# Patient Record
Sex: Female | Born: 1975 | Race: Black or African American | Hispanic: No | Marital: Married | Smoking: Former smoker
Health system: Southern US, Community
[De-identification: ages and names within clinical notes are randomized; demographics above are authoritative.]

## PROBLEM LIST (undated history)

## (undated) DIAGNOSIS — D649 Anemia, unspecified: Secondary | ICD-10-CM

## (undated) DIAGNOSIS — K219 Gastro-esophageal reflux disease without esophagitis: Secondary | ICD-10-CM

## (undated) DIAGNOSIS — D259 Leiomyoma of uterus, unspecified: Secondary | ICD-10-CM

## (undated) DIAGNOSIS — G500 Trigeminal neuralgia: Secondary | ICD-10-CM

## (undated) DIAGNOSIS — I10 Essential (primary) hypertension: Secondary | ICD-10-CM

---

## 1997-08-23 ENCOUNTER — Inpatient Hospital Stay (HOSPITAL_COMMUNITY): Admission: AD | Admit: 1997-08-23 | Discharge: 1997-08-26 | Payer: Self-pay | Admitting: Obstetrics & Gynecology

## 1997-08-23 ENCOUNTER — Emergency Department (HOSPITAL_COMMUNITY): Admission: EM | Admit: 1997-08-23 | Discharge: 1997-08-23 | Payer: Self-pay | Admitting: Emergency Medicine

## 1997-10-24 ENCOUNTER — Other Ambulatory Visit: Admission: RE | Admit: 1997-10-24 | Discharge: 1997-10-24 | Payer: Self-pay | Admitting: *Deleted

## 1998-03-20 ENCOUNTER — Inpatient Hospital Stay (HOSPITAL_COMMUNITY): Admission: AD | Admit: 1998-03-20 | Discharge: 1998-03-22 | Payer: Self-pay | Admitting: *Deleted

## 1998-04-22 ENCOUNTER — Other Ambulatory Visit: Admission: RE | Admit: 1998-04-22 | Discharge: 1998-04-22 | Payer: Self-pay | Admitting: *Deleted

## 1998-05-07 ENCOUNTER — Emergency Department (HOSPITAL_COMMUNITY): Admission: EM | Admit: 1998-05-07 | Discharge: 1998-05-07 | Payer: Self-pay | Admitting: Emergency Medicine

## 1999-03-31 ENCOUNTER — Emergency Department (HOSPITAL_COMMUNITY): Admission: EM | Admit: 1999-03-31 | Discharge: 1999-03-31 | Payer: Self-pay | Admitting: Emergency Medicine

## 1999-05-09 ENCOUNTER — Emergency Department (HOSPITAL_COMMUNITY): Admission: EM | Admit: 1999-05-09 | Discharge: 1999-05-09 | Payer: Self-pay | Admitting: Emergency Medicine

## 2002-10-30 ENCOUNTER — Emergency Department (HOSPITAL_COMMUNITY): Admission: EM | Admit: 2002-10-30 | Discharge: 2002-10-31 | Payer: Self-pay | Admitting: Emergency Medicine

## 2004-07-03 ENCOUNTER — Other Ambulatory Visit: Admission: RE | Admit: 2004-07-03 | Discharge: 2004-07-03 | Payer: Self-pay | Admitting: Obstetrics and Gynecology

## 2005-01-20 ENCOUNTER — Inpatient Hospital Stay (HOSPITAL_COMMUNITY): Admission: AD | Admit: 2005-01-20 | Discharge: 2005-01-22 | Payer: Self-pay | Admitting: Obstetrics and Gynecology

## 2005-10-21 ENCOUNTER — Emergency Department (HOSPITAL_COMMUNITY): Admission: EM | Admit: 2005-10-21 | Discharge: 2005-10-22 | Payer: Self-pay | Admitting: Emergency Medicine

## 2006-11-16 ENCOUNTER — Emergency Department (HOSPITAL_COMMUNITY): Admission: EM | Admit: 2006-11-16 | Discharge: 2006-11-16 | Payer: Self-pay | Admitting: Emergency Medicine

## 2007-07-03 ENCOUNTER — Emergency Department (HOSPITAL_COMMUNITY): Admission: EM | Admit: 2007-07-03 | Discharge: 2007-07-03 | Payer: Self-pay | Admitting: Emergency Medicine

## 2007-07-04 ENCOUNTER — Emergency Department (HOSPITAL_COMMUNITY): Admission: EM | Admit: 2007-07-04 | Discharge: 2007-07-04 | Payer: Self-pay | Admitting: Emergency Medicine

## 2007-07-05 ENCOUNTER — Emergency Department (HOSPITAL_COMMUNITY): Admission: EM | Admit: 2007-07-05 | Discharge: 2007-07-05 | Payer: Self-pay | Admitting: Emergency Medicine

## 2010-09-25 NOTE — Discharge Summary (Signed)
NAMEEMONNIE, CANNADY             ACCOUNT NO.:  [id number]   MEDICAL RECORD NO.:  [id number]          PATIENT TYPE:  INP   LOCATION:  9312                          FACILITY:  [facility]   PHYSICIAN:  Naima A. Dillard, M.D. DATE OF BIRTH:  23-May-1975   DATE OF ADMISSION:  01/20/2005  DATE OF DISCHARGE:  01/22/2005                                 DISCHARGE SUMMARY   DISCHARGE DIAGNOSIS:  Pyelonephritis.   HISTORY OF PRESENT ILLNESS:  Ms. Kuipers is a 35 year old female, para 3-0-0-  3, who has a history of recurrent pyelonephritis, who presents with symptoms  of the same.  Please see the patient's dictated history and physical  examination for details.   PREOPERATIVE/ADMISSION PHYSICAL EXAMINATION:  VITAL SIGNS:  Temperature  100.1 which decreased to 98.4 degrees Fahrenheit orally, pulse 82,  respirations 20, blood pressure 143/81 with a followup pressure of 102/53.  GENERAL:  Within normal limits.  MUSCULOSKELETAL:  Do note that the patient did have bilateral CVA  tenderness.  PELVIC:  Deferred at the patient's request.   HOSPITAL COURSE:  On the day of admission, the patient's urinalysis revealed  a large amount of leukocytes with a positive nitrite, followed by a  microscopic revealing 21-50 white blood cells.  The patient was placed on IV  antibiotics as her temperature reached a maximum of 101.9 degrees Fahrenheit  orally.  She responded appropriately to this therapy and by hospital day #3  had remained afebrile for 24 hours.  Since this is the patient's third  episode of pyelonephritis, she will be referred to a urologist for  evaluation as an outpatient.   DISCHARGE MEDICATIONS:  Cipro 500 mg, one tablet twice daily at least eight  hours apart for 11 additional days.   FOLLOWUP:  1.  The patient is scheduled for a follow up with Dr. Normand Sloop on February 08, 2005 at 9:30 a.m.  2.  The patient will also be notified by Dr. Redmond Baseman office of a scheduled      appointment with a  urologist.   DISCHARGE INSTRUCTIONS:  1.  The patient's activity is without restriction.  2.  Diet is without restriction.      [place] J. Adline Peals.      Naima A. Normand Sloop, M.D.  Electronically Signed    EJP/MEDQ  D:  02/24/2005  T:  02/24/2005  Job:  [id number]

## 2010-09-25 NOTE — H&P (Signed)
Jamie James, Jamie James             ACCOUNT NO.:  [id number]   MEDICAL RECORD NO.:  [id number]          PATIENT TYPE:  INP   LOCATION:  9312                          FACILITY:  [facility]   PHYSICIAN:  Naima A. Dillard, M.D. DATE OF BIRTH:  1976/05/08   DATE OF ADMISSION:  01/20/2005  DATE OF DISCHARGE:                                HISTORY & PHYSICAL   ADMISSION DIAGNOSIS:  1.  Pyelonephritis.  2.  Abnormal menstrual cycle.   HISTORY OF PRESENT ILLNESS:  The patient is a 35 year old gravida 3, para 3,  LMP first week of August. She does report that on Sunday she had cramping  that was  suprapubic with spotting, and then developed back pain with  dysuria, urgency, and frequency. She does have a history of pyelonephritis  x3 in 1999, January 2006, and at present.  She denies any history of kidney  disease or kidney stones. She is not having any abdominal pain. No nausea or  vomiting.  She is admitted to [facility] for IV fluids  and IV antibiotics.  At home, her temperature was 103, and on admission to  MAU it was 101.   MEDICAL HISTORY:  1.  Unremarkable, except the patient's history of multiple episodes of      pyelonephritis.  2.  The patient had an abnormal Pap with gonorrhea diagnosed in March 2006.      She was treated for gonorrhea and her follow-up Pap smear was normal.   ALLERGIES:  She has no known drug allergies.   MEDICATIONS:  She takes no current medications.   SOCIAL HISTORY:  She denies the use of tobacco, alcohol or illicit drugs.   LABORATORY DATA:  Her lab work today - CBC shows WBC 11.6, hemoglobin 12.1,  hematocrit 36.0 and platelets 216,000. Her differential shows neutrophils  87, absolute granulocytes 10.1, with the rest being within normal limits.  Her metabolic panel was within normal limits except potassium 3.4 and  glucose 101.  Her urine specimen showed positive nitrites, 21-50 WBCs, and  cloudy.  She had blood cultures done x2.  Urine  pregnancy test is negative.   REVIEW OF SYSTEMS:  The patient is typical of one with pyelonephritis.  Admitted for IV hydration and IV antibiotics.   PHYSICAL EXAMINATION:  VITAL SIGNS:  Temperature on admission was 101 and  came down to 98.4,  pulse 82, respirations 20. Initial blood pressure 143/81  and follow-up 102/53.  HEENT:  Unremarkable.  HEART:  Regular rate and rhythm.  LUNGS:  Clear.  ABDOMEN:  Soft and nontender.  BACK:  Shows bilateral CVA tenderness.  GU EXAM:  Deferred by the patient.  EXTREMITIES:  Show no edema, no calf tenderness.   ASSESSMENT:  1.  Pyelonephritis.  2.  Abnormal menstrual cycle.   PLAN:  Admit per Dr. Jaymes Graff.  IV fluid hydration, IV antibiotics.  Check urine culture and blood cultures.  CBC with differential in the a.m.  Refer the patient to urology once the patient leaves the hospital.      Rica Koyanagi, C.N.M.  Naima A. Normand Sloop, M.D.  Electronically Signed    SDM/MEDQ  D:  01/20/2005  T:  01/20/2005  Job:  [id number]

## 2011-02-01 LAB — URINALYSIS, ROUTINE W REFLEX MICROSCOPIC
Bilirubin Urine: NEGATIVE
Ketones, ur: 15 — AB
Protein, ur: 30 — AB
Urobilinogen, UA: 0.2

## 2011-02-01 LAB — I-STAT 8, (EC8 V) (CONVERTED LAB)
BUN: 15
Bicarbonate: 24
Chloride: 103
Glucose, Bld: 128 — ABNORMAL HIGH
HCT: 48 — ABNORMAL HIGH
Hemoglobin: 16.3 — ABNORMAL HIGH
Operator id: 288831
Potassium: 3.7
Sodium: 135
TCO2: 25
pCO2, Ven: 35.5 — ABNORMAL LOW
pH, Ven: 7.437 — ABNORMAL HIGH

## 2011-02-01 LAB — DIFFERENTIAL
Basophils Absolute: 0
Lymphocytes Relative: 7 — ABNORMAL LOW
Monocytes Absolute: 1
Neutro Abs: 14.6 — ABNORMAL HIGH
Neutrophils Relative %: 87 — ABNORMAL HIGH

## 2011-02-01 LAB — CBC
Hemoglobin: 13.8
RBC: 4.88
RDW: 15.2

## 2011-02-01 LAB — POCT I-STAT CREATININE
Creatinine, Ser: 1
Operator id: 288831

## 2011-02-01 LAB — URINE MICROSCOPIC-ADD ON

## 2011-02-23 LAB — URINALYSIS, ROUTINE W REFLEX MICROSCOPIC
Nitrite: POSITIVE — AB
Specific Gravity, Urine: 1.019
Urobilinogen, UA: 1
pH: 6.5

## 2011-02-23 LAB — URINE MICROSCOPIC-ADD ON

## 2011-02-23 LAB — BASIC METABOLIC PANEL
BUN: 11
Calcium: 9
GFR calc non Af Amer: >60
Glucose, Bld: 89
Potassium: 3.8
Sodium: 139

## 2011-02-23 LAB — WET PREP, GENITAL: Trich, Wet Prep: NONE SEEN

## 2011-02-23 LAB — RPR: RPR Ser Ql: NONREACTIVE

## 2011-02-23 LAB — PREGNANCY, URINE: Preg Test, Ur: NEGATIVE

## 2012-06-20 ENCOUNTER — Other Ambulatory Visit: Payer: Self-pay | Admitting: Family Medicine

## 2012-06-20 DIAGNOSIS — N644 Mastodynia: Secondary | ICD-10-CM

## 2012-07-04 ENCOUNTER — Ambulatory Visit
Admission: RE | Admit: 2012-07-04 | Discharge: 2012-07-04 | Disposition: A | Discharge status: Home / Self Care | Payer: 59 | Source: Ambulatory Visit | Attending: Family Medicine | Admitting: Family Medicine

## 2012-07-04 DIAGNOSIS — N644 Mastodynia: Secondary | ICD-10-CM

## 2013-01-18 ENCOUNTER — Other Ambulatory Visit: Payer: Self-pay | Admitting: Family Medicine

## 2013-01-18 DIAGNOSIS — N6489 Other specified disorders of breast: Secondary | ICD-10-CM

## 2013-02-02 ENCOUNTER — Ambulatory Visit
Admission: RE | Admit: 2013-02-02 | Discharge: 2013-02-02 | Disposition: A | Discharge status: Home / Self Care | Payer: 59 | Source: Ambulatory Visit | Attending: Family Medicine | Admitting: Family Medicine

## 2013-02-02 DIAGNOSIS — N6489 Other specified disorders of breast: Secondary | ICD-10-CM

## 2013-06-11 ENCOUNTER — Other Ambulatory Visit: Payer: Self-pay | Admitting: Neurology

## 2014-02-07 ENCOUNTER — Other Ambulatory Visit: Payer: Self-pay | Admitting: Physician Assistant

## 2014-02-07 DIAGNOSIS — N63 Unspecified lump in unspecified breast: Secondary | ICD-10-CM

## 2014-02-11 ENCOUNTER — Ambulatory Visit
Admission: RE | Admit: 2014-02-11 | Discharge: 2014-02-11 | Disposition: A | Discharge status: Home / Self Care | Payer: 59 | Source: Ambulatory Visit | Attending: Physician Assistant | Admitting: Physician Assistant

## 2014-02-11 ENCOUNTER — Other Ambulatory Visit: Payer: Self-pay | Admitting: Physician Assistant

## 2014-02-11 ENCOUNTER — Encounter (INDEPENDENT_AMBULATORY_CARE_PROVIDER_SITE_OTHER): Payer: Self-pay

## 2014-02-11 ENCOUNTER — Ambulatory Visit
Admission: RE | Admit: 2014-02-11 | Discharge: 2014-02-11 | Disposition: A | Discharge status: Home / Self Care | Payer: Self-pay | Source: Ambulatory Visit | Attending: Physician Assistant | Admitting: Physician Assistant

## 2014-02-11 DIAGNOSIS — N63 Unspecified lump in unspecified breast: Secondary | ICD-10-CM

## 2014-08-07 ENCOUNTER — Telehealth: Payer: Self-pay | Admitting: Cardiology

## 2014-08-07 NOTE — Telephone Encounter (Signed)
Received records from [facility] for appointment with Dr Ellyn Hack on 09/16/14.  Records given to [facility] (medical records) for Dr Allison Quarry schedule on 09/16/14. lp

## 2014-09-16 ENCOUNTER — Ambulatory Visit: Payer: Self-pay | Admitting: Cardiology

## 2016-02-14 ENCOUNTER — Encounter (HOSPITAL_COMMUNITY): Payer: Self-pay | Admitting: *Deleted

## 2016-02-14 ENCOUNTER — Emergency Department (HOSPITAL_COMMUNITY)
Admission: EM | Admit: 2016-02-14 | Discharge: 2016-02-14 | Disposition: A | Discharge status: Home / Self Care | Payer: 59 | Attending: Emergency Medicine | Admitting: Emergency Medicine

## 2016-02-14 DIAGNOSIS — Y999 Unspecified external cause status: Secondary | ICD-10-CM | POA: Insufficient documentation

## 2016-02-14 DIAGNOSIS — S0091XA Abrasion of unspecified part of head, initial encounter: Secondary | ICD-10-CM | POA: Diagnosis not present

## 2016-02-14 DIAGNOSIS — Y939 Activity, unspecified: Secondary | ICD-10-CM | POA: Insufficient documentation

## 2016-02-14 DIAGNOSIS — Z23 Encounter for immunization: Secondary | ICD-10-CM | POA: Diagnosis not present

## 2016-02-14 DIAGNOSIS — I10 Essential (primary) hypertension: Secondary | ICD-10-CM | POA: Insufficient documentation

## 2016-02-14 DIAGNOSIS — W228XXA Striking against or struck by other objects, initial encounter: Secondary | ICD-10-CM | POA: Diagnosis not present

## 2016-02-14 DIAGNOSIS — Y929 Unspecified place or not applicable: Secondary | ICD-10-CM | POA: Insufficient documentation

## 2016-02-14 DIAGNOSIS — S0990XA Unspecified injury of head, initial encounter: Secondary | ICD-10-CM | POA: Diagnosis present

## 2016-02-14 HISTORY — DX: Essential (primary) hypertension: I10

## 2016-02-14 HISTORY — DX: Trigeminal neuralgia: G50.0

## 2016-02-14 MED ORDER — TETANUS-DIPHTH-ACELL PERTUSSIS 5-2.5-18.5 LF-MCG/0.5 IM SUSP
0.5000 mL | Freq: Once | INTRAMUSCULAR | Status: AC
  Administered 2016-02-14: 0.5 mL via INTRAMUSCULAR
  Filled 2016-02-14: qty 0.5

## 2016-02-14 NOTE — ED Notes (Signed)
Declined W/C at D/C and was escorted to lobby by RN. 

## 2016-02-14 NOTE — ED Triage Notes (Signed)
Pt reports opening a crate and being hit in head with piece of debris, had flat end of screws hit her head, has laceration/wound to top of head that was initially bleeding. No bleeding noted at triage, unable to locate the laceration. Denies loc.

## 2016-02-14 NOTE — ED Provider Notes (Signed)
[place] [facility] DEPT Provider Note   CSN: [id number] Arrival date & time: 02/14/16  1705   By signing my name below, I, Dolores Hoose, attest that this documentation has been prepared under the direction and in the presence of non-physician practitioner, Pearlie Oyster, PA-C. Electronically Signed: Dolores Hoose, Scribe. 02/14/2016. 6:09 PM.   History   Chief Complaint Chief Complaint  Patient presents with  . Laceration   The history is provided by the patient. No language interpreter was used.    HPI Comments:  Jamie James is a 40 y.o. female who presents to the Emergency Department complaining of sudden-onset constant unchanged head pain beginning a few hours ago with associated head wound. Pt reports that she was trying to get a statue out of a crate when she hit the crate with a hammer, and a piece of wood rebounded and hit her in the head. She notes that it was the flat side of the nail that hit her head. Pt states that her wound was bleeding heavily but the bleeding has been controlled since arrival to ER. She is not up to date on her tetanus. She has tried advil for her pain, with minimal relief.  She denies any syncopal episodes, nausea, vomiting or changes in vision.  Past Medical History:  Diagnosis Date  . Hypertension   . Trigeminal neuralgia     There are no active problems to display for this patient.   History reviewed. No pertinent surgical history.  OB History    No data available       Home Medications    Prior to Admission medications   Not on File    Family History History reviewed. No pertinent family history.  Social History Social History  Substance Use Topics  . Smoking status: Never Smoker  . Smokeless tobacco: Not on file  . Alcohol use Yes     Comment: occ     Allergies   Review of patient's allergies indicates no known allergies.   Review of Systems Review of Systems  Eyes: Negative for visual disturbance.  Gastrointestinal:  Negative for nausea and vomiting.  Skin: Positive for wound.  Neurological: Negative for syncope.     Physical Exam Updated Vital Signs BP (!) 150/106 (BP Location: Right Arm)   Pulse 77   Temp 98.2 F (36.8 C) (Oral)   Resp 18   SpO2 100%   Physical Exam  Constitutional: She is oriented to person, place, and time. She appears well-developed and well-nourished. No distress.  HENT:  Head: Normocephalic and atraumatic.  Cardiovascular: Normal rate, regular rhythm, normal heart sounds and intact distal pulses.   No murmur heard. Pulmonary/Chest: Effort normal and breath sounds normal. No respiratory distress. She has no wheezes. She has no rales.  Musculoskeletal: Normal range of motion.  Neurological: She is alert and oriented to person, place, and time. No cranial nerve deficit.  Skin: Skin is warm and dry.  1cm superficial abrasion to top of her head.   Psychiatric: She has a normal mood and affect.  Nursing note and vitals reviewed.    ED Treatments / Results  DIAGNOSTIC STUDIES:  Oxygen Saturation is 100% on RA, normal by my interpretation.    COORDINATION OF CARE:  6:19 PM Discussed treatment plan with pt at bedside which includes updating her tetanus and pt agreed to plan.  Labs (all labs ordered are listed, but only abnormal results are displayed) Labs Reviewed - No data to display  EKG  EKG Interpretation None  Radiology No results found.  Procedures Procedures (including critical care time)  Medications Ordered in ED Medications  Tdap (BOOSTRIX) injection 0.5 mL (0.5 mLs Intramuscular Given 02/14/16 1850)     Initial Impression / Assessment and Plan / ED Course  I have reviewed the triage vital signs and the nursing notes.  Pertinent labs & imaging results that were available during my care of the patient were reviewed by me and considered in my medical decision making (see chart for details).  Clinical Course   Jamie James is a 40  y.o. female who presents to ED after head injury. No focal neuro deficits on exam. 0 on [place] CT head rolled. She has a superficial abrasion of the head that is not amenable to laceration repair. Tetanus updated in ED. home wound care instructions were discussed. Reasons to return to ED discussed and all questions answered.  Final Clinical Impressions(s) / ED Diagnoses   Final diagnoses:  Abrasion of head, initial encounter    New Prescriptions There are no discharge medications for this patient.  I personally performed the services described in this documentation, which was scribed in my presence. The recorded information has been reviewed and is accurate.     [facility] Kumari Sculley, PA-C 02/14/16 1918    Davonna Belling, MD 02/15/16 [phone]

## 2016-02-14 NOTE — Discharge Instructions (Signed)
Wash the area twice daily. Keep area clean and dry. Return to ER for new or worsening symptoms, any additional concerns.

## 2017-06-15 ENCOUNTER — Other Ambulatory Visit: Payer: Self-pay | Admitting: Obstetrics & Gynecology

## 2017-06-21 NOTE — Patient Instructions (Addendum)
Your procedure is scheduled on:  Thursday, Feb 14  Enter through the Main Entrance of [facility] at:  11:30 am   Pick up the phone at the desk and dial [phone].  Call this number if you have problems the morning of surgery: [phone].  Remember: Do NOT eat after midnight Wednesday.  Do NOT drink clear liquids (including water) after 7 am Thursday, day of surgery.  Take these medicines the morning of surgery with a SIP OF WATER:  carbatrol  Do Not smoke on the day of surgery.  Stop herbal medications and supplements at this time.  Do NOT wear jewelry (body piercing), metal hair clips/bobby pins, make-up, or nail polish. Do NOT wear lotions, powders, or perfumes.  You may wear deoderant. Do NOT shave for 48 hours prior to surgery. Do NOT bring valuables to the hospital. Contacts may not be worn into surgery.  Leave suitcase in car.  After surgery it may be brought to your room.  For patients admitted to the hospital, checkout time is 11:00 AM the day of discharge. Have a responsible adult drive you home and stay with you for 24 hours after your procedure.  Home With [facility] [place] cell [phone]

## 2017-06-22 ENCOUNTER — Other Ambulatory Visit: Payer: Self-pay

## 2017-06-22 ENCOUNTER — Encounter (HOSPITAL_COMMUNITY): Payer: Self-pay

## 2017-06-22 ENCOUNTER — Encounter (HOSPITAL_COMMUNITY)
Admission: RE | Admit: 2017-06-22 | Discharge: 2017-06-22 | Disposition: A | Discharge status: Home / Self Care | Payer: 59 | Source: Ambulatory Visit | Attending: Obstetrics & Gynecology | Admitting: Obstetrics & Gynecology

## 2017-06-22 HISTORY — DX: Gastro-esophageal reflux disease without esophagitis: K21.9

## 2017-06-22 HISTORY — DX: Leiomyoma of uterus, unspecified: D25.9

## 2017-06-22 HISTORY — DX: Anemia, unspecified: D64.9

## 2017-06-22 LAB — CBC
HEMATOCRIT: 37.5 % (ref 36.0–46.0)
Hemoglobin: 12.5 g/dL (ref 12.0–15.0)
MCH: 29.1 pg (ref 26.0–34.0)
MCHC: 33.3 g/dL (ref 30.0–36.0)
MCV: 87.4 fL (ref 78.0–100.0)
Platelets: 193 10*3/uL (ref 150–400)
RBC: 4.29 MIL/uL (ref 3.87–5.11)
RDW: 15 % (ref 11.5–15.5)
WBC: 7.3 10*3/uL (ref 4.0–10.5)

## 2017-06-22 LAB — BASIC METABOLIC PANEL
Anion gap: 8 (ref 5–15)
BUN: 15 mg/dL (ref 6–20)
CHLORIDE: 103 mmol/L (ref 101–111)
CO2: 26 mmol/L (ref 22–32)
Calcium: 9.1 mg/dL (ref 8.9–10.3)
Creatinine, Ser: 0.71 mg/dL (ref 0.44–1.00)
GFR calc Af Amer: >60 mL/min (ref >60)
GFR calc non Af Amer: >60 mL/min (ref >60)
Glucose, Bld: 91 mg/dL (ref 65–99)
POTASSIUM: 3.6 mmol/L (ref 3.5–5.1)
Sodium: 137 mmol/L (ref 135–145)

## 2017-06-22 NOTE — H&P (Signed)
Jamie James is an 42 y.o. female with large symptomatic fibroids, here for abdominal hysterectomy and remove fallopian tubes.  Medical management with NSAIDs, OCs and Progestin IUD failed, likely since fibroids are large. Anemia, taking iron.  Normal paps, no breast issues, nl mammogram. Endometrial biopsy normal.  Office sono reviewed.   Patient's last menstrual period was 05/28/2017 (approximate).    Past Medical History:  Diagnosis Date  . Anemia   . Fibroid, uterine   . GERD (gastroesophageal reflux disease)    occasional -diet controlled  . Hypertension   . SVD (spontaneous vaginal delivery)    x 3  . Trigeminal neuralgia     No past surgical history on file.  No family history on file.  Social History:  reports that she quit smoking about 11 years ago. Her smoking use included cigarettes. She has a 3.75 pack-year smoking history. she has never used smokeless tobacco. She reports that she drinks alcohol. She reports that she does not use drugs.  Allergies: No Known Allergies  No medications prior to admission.    ROS neg  Last menstrual period 05/28/2017. Physical Exam Pulse 78   Temp 98 F (36.7 C) (Oral)   Resp 16   LMP 05/28/2017 (Approximate)   SpO2 100%   A&O x 3, no acute distress. Pleasant HEENT neg, no thyromegaly Lungs CTA bilat CV RRR, S1S2 normal Abdo soft, non tender, non acute Extr no edema/ tenderness Pelvic enlarged fibroid uterus about 16-18 wks filling up the pelvis.   Results for orders placed or performed during the hospital encounter of 06/22/17 (from the past 24 hour(s))  Basic metabolic panel     Status: None   Collection Time: 06/22/17  9:40 AM  Result Value Ref Range   Sodium 137 135 - 145 mmol/L   Potassium 3.6 3.5 - 5.1 mmol/L   Chloride 103 101 - 111 mmol/L   CO2 26 22 - 32 mmol/L   Glucose, Bld 91 65 - 99 mg/dL   BUN 15 6 - 20 mg/dL   Creatinine, Ser 0.71 0.44 - 1.00 mg/dL   Calcium 9.1 8.9 - 10.3 mg/dL   GFR calc non  Af Amer >60 >60 mL/min   GFR calc Af Amer >60 >60 mL/min   Anion gap 8 5 - 15  CBC     Status: None   Collection Time: 06/22/17  9:40 AM  Result Value Ref Range   WBC 7.3 4.0 - 10.5 K/uL   RBC 4.29 3.87 - 5.11 MIL/uL   Hemoglobin 12.5 12.0 - 15.0 g/dL   HCT 37.5 36.0 - 46.0 %   MCV 87.4 78.0 - 100.0 fL   MCH 29.1 26.0 - 34.0 pg   MCHC 33.3 30.0 - 36.0 g/dL   RDW 15.0 11.5 - 15.5 %   Platelets 193 150 - 400 K/uL    No results found.  Assessment/Plan: 42 yo female with symptomatic uterine fibroids. All options reviewed, desires permanent solution for menorrhagia/ dysmenorrhea and desires hysterectomy. Will also recommend bilateral salpingectomy for ovarian cancer risk reduction. Retain ovaries due to age.  Options were L/scopic with da Vinci hysterectomy vs abdominal. Due to size risk from L/scopy to open conversion reviewed, definitely not use any in-situ morcellation. After all options reviewed, plan was to proceed with Total Abdominal hysterectomy and bilateral salpingectomy.  Risks/complications of surgery reviewed incl infection, bleeding, damage to internal organs including bladder, bowels, ureters, blood vessels, other risks from anesthesia, VTE and delayed complications of any surgery, complications  in future surgery reviewed. \   Elveria Royals, MD

## 2017-06-23 ENCOUNTER — Other Ambulatory Visit: Payer: Self-pay

## 2017-06-23 ENCOUNTER — Inpatient Hospital Stay (HOSPITAL_COMMUNITY)
Admission: RE | Admit: 2017-06-23 | Discharge: 2017-06-25 | DRG: 743 | Disposition: A | Discharge status: Home / Self Care | Payer: 59 | Source: Ambulatory Visit | Attending: Obstetrics & Gynecology | Admitting: Obstetrics & Gynecology

## 2017-06-23 ENCOUNTER — Encounter (HOSPITAL_COMMUNITY): Payer: Self-pay

## 2017-06-23 ENCOUNTER — Inpatient Hospital Stay (HOSPITAL_COMMUNITY): Payer: 59 | Admitting: Anesthesiology

## 2017-06-23 ENCOUNTER — Encounter (HOSPITAL_COMMUNITY)
Admission: RE | Disposition: A | Discharge status: Home / Self Care | Payer: Self-pay | Source: Ambulatory Visit | Attending: Obstetrics & Gynecology

## 2017-06-23 DIAGNOSIS — N920 Excessive and frequent menstruation with regular cycle: Secondary | ICD-10-CM | POA: Diagnosis present

## 2017-06-23 DIAGNOSIS — D259 Leiomyoma of uterus, unspecified: Principal | ICD-10-CM | POA: Diagnosis present

## 2017-06-23 DIAGNOSIS — N946 Dysmenorrhea, unspecified: Secondary | ICD-10-CM | POA: Diagnosis present

## 2017-06-23 DIAGNOSIS — Z87891 Personal history of nicotine dependence: Secondary | ICD-10-CM

## 2017-06-23 DIAGNOSIS — Z90710 Acquired absence of both cervix and uterus: Secondary | ICD-10-CM | POA: Diagnosis present

## 2017-06-23 HISTORY — DX: Leiomyoma of uterus, unspecified: D25.9

## 2017-06-23 HISTORY — PX: HYSTERECTOMY ABDOMINAL WITH SALPINGECTOMY: SHX6725

## 2017-06-23 LAB — PREGNANCY, URINE: PREG TEST UR: NEGATIVE

## 2017-06-23 SURGERY — HYSTERECTOMY, TOTAL, ABDOMINAL, WITH SALPINGECTOMY
Anesthesia: General | Site: Abdomen | Laterality: Bilateral

## 2017-06-23 MED ORDER — SUGAMMADEX SODIUM 200 MG/2ML IV SOLN
INTRAVENOUS | Status: DC | PRN
  Administered 2017-06-23: 170 mg via INTRAVENOUS

## 2017-06-23 MED ORDER — NALOXONE HCL 0.4 MG/ML IJ SOLN: 0.4000 mg | INTRAMUSCULAR | Status: DC | PRN

## 2017-06-23 MED ORDER — MENTHOL 3 MG MT LOZG: 1.0000 | LOZENGE | OROMUCOSAL | Status: DC | PRN

## 2017-06-23 MED ORDER — OXYCODONE HCL 5 MG PO TABS: 5.0000 mg | ORAL_TABLET | Freq: Once | ORAL | Status: DC | PRN

## 2017-06-23 MED ORDER — BUPIVACAINE HCL (PF) 0.25 % IJ SOLN
INTRAMUSCULAR | Status: AC
  Filled 2017-06-23: qty 30

## 2017-06-23 MED ORDER — SODIUM CHLORIDE 0.9% FLUSH: 9.0000 mL | INTRAVENOUS | Status: DC | PRN

## 2017-06-23 MED ORDER — PROPOFOL 10 MG/ML IV BOLUS
INTRAVENOUS | Status: AC
  Filled 2017-06-23: qty 20

## 2017-06-23 MED ORDER — ONDANSETRON HCL 4 MG/2ML IJ SOLN: 4.0000 mg | Freq: Four times a day (QID) | INTRAMUSCULAR | Status: DC | PRN

## 2017-06-23 MED ORDER — ACETAMINOPHEN 10 MG/ML IV SOLN
INTRAVENOUS | Status: DC | PRN
  Administered 2017-06-23: 1000 mg via INTRAVENOUS

## 2017-06-23 MED ORDER — FENTANYL CITRATE (PF) 250 MCG/5ML IJ SOLN
INTRAMUSCULAR | Status: AC
  Filled 2017-06-23: qty 5

## 2017-06-23 MED ORDER — KETOROLAC TROMETHAMINE 30 MG/ML IJ SOLN
INTRAMUSCULAR | Status: AC
  Filled 2017-06-23: qty 1

## 2017-06-23 MED ORDER — SCOPOLAMINE 1 MG/3DAYS TD PT72
MEDICATED_PATCH | TRANSDERMAL | Status: AC
  Administered 2017-06-23: 1.5 mg via TRANSDERMAL
  Filled 2017-06-23: qty 1

## 2017-06-23 MED ORDER — HYDROMORPHONE 1 MG/ML IV SOLN
INTRAVENOUS | Status: DC
  Administered 2017-06-23: 18:00:00 via INTRAVENOUS
  Filled 2017-06-23: qty 25

## 2017-06-23 MED ORDER — BUPIVACAINE HCL (PF) 0.25 % IJ SOLN
INTRAMUSCULAR | Status: DC | PRN
  Administered 2017-06-23: 18 mL

## 2017-06-23 MED ORDER — MIDAZOLAM HCL 2 MG/2ML IJ SOLN
INTRAMUSCULAR | Status: AC
  Filled 2017-06-23: qty 2

## 2017-06-23 MED ORDER — PHENYLEPHRINE 40 MCG/ML (10ML) SYRINGE FOR IV PUSH (FOR BLOOD PRESSURE SUPPORT)
PREFILLED_SYRINGE | INTRAVENOUS | Status: AC
  Filled 2017-06-23: qty 10

## 2017-06-23 MED ORDER — DIPHENHYDRAMINE HCL 50 MG/ML IJ SOLN: 12.5000 mg | Freq: Four times a day (QID) | INTRAMUSCULAR | Status: DC | PRN

## 2017-06-23 MED ORDER — PHENYLEPHRINE HCL 10 MG/ML IJ SOLN
INTRAMUSCULAR | Status: DC | PRN
  Administered 2017-06-23 (×6): .04 mg via INTRAVENOUS

## 2017-06-23 MED ORDER — EPHEDRINE 5 MG/ML INJ
INTRAVENOUS | Status: AC
  Filled 2017-06-23: qty 10

## 2017-06-23 MED ORDER — ONDANSETRON HCL 4 MG/2ML IJ SOLN
INTRAMUSCULAR | Status: AC
  Filled 2017-06-23: qty 2

## 2017-06-23 MED ORDER — HYDROMORPHONE HCL 1 MG/ML IJ SOLN
INTRAMUSCULAR | Status: AC
  Filled 2017-06-23: qty 0.5

## 2017-06-23 MED ORDER — EPHEDRINE SULFATE 50 MG/ML IJ SOLN
INTRAMUSCULAR | Status: DC | PRN
  Administered 2017-06-23: 5 mg via INTRAVENOUS

## 2017-06-23 MED ORDER — SCOPOLAMINE 1 MG/3DAYS TD PT72
1.0000 | MEDICATED_PATCH | Freq: Once | TRANSDERMAL | Status: DC
  Administered 2017-06-23: 1.5 mg via TRANSDERMAL

## 2017-06-23 MED ORDER — LISINOPRIL-HYDROCHLOROTHIAZIDE 20-12.5 MG PO TABS: 1.0000 | ORAL_TABLET | Freq: Every day | ORAL | Status: DC

## 2017-06-23 MED ORDER — KETOROLAC TROMETHAMINE 30 MG/ML IJ SOLN
30.0000 mg | Freq: Three times a day (TID) | INTRAMUSCULAR | Status: AC
  Administered 2017-06-23 – 2017-06-24 (×3): 30 mg via INTRAVENOUS
  Filled 2017-06-23 (×3): qty 1

## 2017-06-23 MED ORDER — ACETAMINOPHEN 10 MG/ML IV SOLN
INTRAVENOUS | Status: AC
  Filled 2017-06-23: qty 100

## 2017-06-23 MED ORDER — ONDANSETRON HCL 4 MG/2ML IJ SOLN
INTRAMUSCULAR | Status: DC | PRN
  Administered 2017-06-23: 4 mg via INTRAVENOUS

## 2017-06-23 MED ORDER — ONDANSETRON HCL 4 MG PO TABS: 4.0000 mg | ORAL_TABLET | Freq: Four times a day (QID) | ORAL | Status: DC | PRN

## 2017-06-23 MED ORDER — MIDAZOLAM HCL 2 MG/2ML IJ SOLN
INTRAMUSCULAR | Status: DC | PRN
  Administered 2017-06-23: 2 mg via INTRAVENOUS

## 2017-06-23 MED ORDER — ARTIFICIAL TEARS OPHTHALMIC OINT
TOPICAL_OINTMENT | OPHTHALMIC | Status: AC
  Filled 2017-06-23: qty 3.5

## 2017-06-23 MED ORDER — CEFAZOLIN SODIUM-DEXTROSE 2-4 GM/100ML-% IV SOLN
2.0000 g | INTRAVENOUS | Status: AC
  Administered 2017-06-23: 2 g via INTRAVENOUS

## 2017-06-23 MED ORDER — HYDROMORPHONE HCL 1 MG/ML IJ SOLN
INTRAMUSCULAR | Status: AC
  Administered 2017-06-23: 0.5 mg via INTRAVENOUS
  Filled 2017-06-23: qty 1

## 2017-06-23 MED ORDER — KETOROLAC TROMETHAMINE 30 MG/ML IJ SOLN
INTRAMUSCULAR | Status: DC | PRN
  Administered 2017-06-23: 30 mg via INTRAVENOUS

## 2017-06-23 MED ORDER — DEXAMETHASONE SODIUM PHOSPHATE 10 MG/ML IJ SOLN
INTRAMUSCULAR | Status: AC
  Filled 2017-06-23: qty 1

## 2017-06-23 MED ORDER — ROCURONIUM BROMIDE 100 MG/10ML IV SOLN
INTRAVENOUS | Status: DC | PRN
  Administered 2017-06-23: 50 mg via INTRAVENOUS
  Administered 2017-06-23 (×4): 10 mg via INTRAVENOUS

## 2017-06-23 MED ORDER — DIPHENHYDRAMINE HCL 12.5 MG/5ML PO ELIX: 12.5000 mg | ORAL_SOLUTION | Freq: Four times a day (QID) | ORAL | Status: DC | PRN

## 2017-06-23 MED ORDER — HYDROCHLOROTHIAZIDE 12.5 MG PO CAPS
12.5000 mg | ORAL_CAPSULE | Freq: Every day | ORAL | Status: DC
  Administered 2017-06-24 – 2017-06-25 (×2): 12.5 mg via ORAL
  Filled 2017-06-23 (×3): qty 1

## 2017-06-23 MED ORDER — GLYCOPYRROLATE 0.2 MG/ML IJ SOLN
INTRAMUSCULAR | Status: DC | PRN
  Administered 2017-06-23 (×2): 0.1 mg via INTRAVENOUS

## 2017-06-23 MED ORDER — HYDROMORPHONE HCL 1 MG/ML IJ SOLN
0.2500 mg | INTRAMUSCULAR | Status: DC | PRN
  Administered 2017-06-23 (×3): 0.5 mg via INTRAVENOUS

## 2017-06-23 MED ORDER — CEFAZOLIN SODIUM-DEXTROSE 2-4 GM/100ML-% IV SOLN
INTRAVENOUS | Status: AC
  Filled 2017-06-23: qty 100

## 2017-06-23 MED ORDER — LACTATED RINGERS IV SOLN
INTRAVENOUS | Status: DC
  Administered 2017-06-23: 21:00:00 via INTRAVENOUS

## 2017-06-23 MED ORDER — DEXAMETHASONE SODIUM PHOSPHATE 10 MG/ML IJ SOLN
INTRAMUSCULAR | Status: DC | PRN
  Administered 2017-06-23: 10 mg via INTRAVENOUS

## 2017-06-23 MED ORDER — FENTANYL CITRATE (PF) 100 MCG/2ML IJ SOLN
INTRAMUSCULAR | Status: DC | PRN
  Administered 2017-06-23: 25 ug via INTRAVENOUS
  Administered 2017-06-23 (×3): 50 ug via INTRAVENOUS
  Administered 2017-06-23: 25 ug via INTRAVENOUS
  Administered 2017-06-23: 50 ug via INTRAVENOUS

## 2017-06-23 MED ORDER — PROPOFOL 10 MG/ML IV BOLUS
INTRAVENOUS | Status: DC | PRN
  Administered 2017-06-23: 180 mg via INTRAVENOUS
  Administered 2017-06-23 (×2): 20 mg via INTRAVENOUS

## 2017-06-23 MED ORDER — SUGAMMADEX SODIUM 200 MG/2ML IV SOLN
INTRAVENOUS | Status: AC
  Filled 2017-06-23: qty 2

## 2017-06-23 MED ORDER — LISINOPRIL 20 MG PO TABS
20.0000 mg | ORAL_TABLET | Freq: Every day | ORAL | Status: DC
  Administered 2017-06-24 – 2017-06-25 (×2): 20 mg via ORAL
  Filled 2017-06-23 (×3): qty 1

## 2017-06-23 MED ORDER — OXYCODONE HCL 5 MG/5ML PO SOLN: 5.0000 mg | Freq: Once | ORAL | Status: DC | PRN

## 2017-06-23 MED ORDER — LIDOCAINE HCL (CARDIAC) 20 MG/ML IV SOLN
INTRAVENOUS | Status: AC
  Filled 2017-06-23: qty 5

## 2017-06-23 MED ORDER — LIDOCAINE HCL (CARDIAC) 20 MG/ML IV SOLN
INTRAVENOUS | Status: DC | PRN
  Administered 2017-06-23: 80 mg via INTRAVENOUS

## 2017-06-23 MED ORDER — CARBAMAZEPINE ER 200 MG PO CP12
400.0000 mg | ORAL_CAPSULE | Freq: Two times a day (BID) | ORAL | Status: DC
  Administered 2017-06-23 – 2017-06-25 (×4): 400 mg via ORAL
  Filled 2017-06-23 (×2): qty 2

## 2017-06-23 MED ORDER — LACTATED RINGERS IV SOLN
INTRAVENOUS | Status: DC
  Administered 2017-06-23 (×2): via INTRAVENOUS
  Administered 2017-06-23: 125 mL/h via INTRAVENOUS

## 2017-06-23 MED ORDER — ROCURONIUM BROMIDE 100 MG/10ML IV SOLN
INTRAVENOUS | Status: AC
  Filled 2017-06-23: qty 1

## 2017-06-23 SURGICAL SUPPLY — 36 items
CANISTER SUCT 3000ML PPV (MISCELLANEOUS) ×2 IMPLANT
CLOTH BEACON ORANGE TIMEOUT ST (SAFETY) ×2 IMPLANT
CONT PATH 16OZ SNAP LID 3702 (MISCELLANEOUS) ×2 IMPLANT
DRAPE CESAREAN BIRTH W POUCH (DRAPES) ×2 IMPLANT
DRAPE WARM FLUID 44X44 (DRAPE) ×1 IMPLANT
DRSG OPSITE POSTOP 4X10 (GAUZE/BANDAGES/DRESSINGS) ×2 IMPLANT
DURAPREP 26ML APPLICATOR (WOUND CARE) ×2 IMPLANT
GAUZE SPONGE 4X4 12PLY STRL (GAUZE/BANDAGES/DRESSINGS) ×1 IMPLANT
GAUZE SPONGE 4X4 16PLY XRAY LF (GAUZE/BANDAGES/DRESSINGS) ×1 IMPLANT
GLOVE BIO SURGEON STRL SZ7 (GLOVE) ×2 IMPLANT
GLOVE BIOGEL PI IND STRL 7.0 (GLOVE) ×4 IMPLANT
GLOVE BIOGEL PI INDICATOR 7.0 (GLOVE) ×4
GOWN STRL REUS W/TWL LRG LVL3 (GOWN DISPOSABLE) ×6 IMPLANT
NEEDLE HYPO 22GX1.5 SAFETY (NEEDLE) ×2 IMPLANT
NS IRRIG 1000ML POUR BTL (IV SOLUTION) ×3 IMPLANT
PACK ABDOMINAL GYN (CUSTOM PROCEDURE TRAY) ×2 IMPLANT
PAD ABD 8X7 1/2 STERILE (GAUZE/BANDAGES/DRESSINGS) ×1 IMPLANT
PAD OB MATERNITY 4.3X12.25 (PERSONAL CARE ITEMS) ×2 IMPLANT
PROTECTOR NERVE ULNAR (MISCELLANEOUS) ×2 IMPLANT
SPONGE LAP 18X18 X RAY DECT (DISPOSABLE) ×4 IMPLANT
STAPLER VISISTAT 35W (STAPLE) IMPLANT
STRIP CLOSURE SKIN 1/2X4 (GAUZE/BANDAGES/DRESSINGS) ×1 IMPLANT
SUT PLAIN 2 0 XLH (SUTURE) IMPLANT
SUT PROLENE 0 CT 1 30 (SUTURE) IMPLANT
SUT VIC AB 0 CT1 18XCR BRD8 (SUTURE) ×3 IMPLANT
SUT VIC AB 0 CT1 36 (SUTURE) ×6 IMPLANT
SUT VIC AB 0 CT1 8-18 (SUTURE) ×6
SUT VIC AB 2-0 SH 27 (SUTURE) ×2
SUT VIC AB 2-0 SH 27XBRD (SUTURE) IMPLANT
SUT VIC AB 4-0 KS 27 (SUTURE) ×1 IMPLANT
SUT VIC AB 4-0 SH 18 (SUTURE) IMPLANT
SUT VICRYL 0 TIES 12 18 (SUTURE) ×2 IMPLANT
SYR CONTROL 10ML LL (SYRINGE) ×2 IMPLANT
TAPE HYPAFIX 4 X10 (GAUZE/BANDAGES/DRESSINGS) ×1 IMPLANT
TOWEL OR 17X24 6PK STRL BLUE (TOWEL DISPOSABLE) ×4 IMPLANT
TRAY FOLEY CATH SILVER 14FR (SET/KITS/TRAYS/PACK) ×2 IMPLANT

## 2017-06-23 NOTE — Transfer of Care (Signed)
Immediate Anesthesia Transfer of Care Note  Patient: Jamie James  Procedure(s) Performed: HYSTERECTOMY ABDOMINAL WITH SALPINGECTOMY (Bilateral Abdomen)  Patient Location: PACU  Anesthesia Type:General  Level of Consciousness: awake and patient cooperative  Airway & Oxygen Therapy: Patient Spontanous Breathing and Patient connected to nasal cannula oxygen  Post-op Assessment: Report given to RN and Post -op Vital signs reviewed and stable  Post vital signs: Reviewed and stable  Last Vitals:  Vitals:   06/23/17 1127  Pulse: 78  Resp: 16  Temp: 36.7 C  SpO2: 100%    Last Pain:  Vitals:   06/23/17 1127  TempSrc: Oral      Patients Stated Pain Goal: 3 (15/86/82 5749)  Complications: No apparent anesthesia complications

## 2017-06-23 NOTE — Anesthesia Procedure Notes (Addendum)
Procedure Name: Intubation Date/Time: 06/23/2017 1:01 PM Performed by: Georgeanne Nim, CRNA Pre-anesthesia Checklist: Patient identified, Emergency Drugs available, Suction available, Patient being monitored and Timeout performed Patient Re-evaluated:Patient Re-evaluated prior to induction Oxygen Delivery Method: Circle system utilized Preoxygenation: Pre-oxygenation with 100% oxygen Induction Type: IV induction Ventilation: Mask ventilation without difficulty Tube size: 7.0 mm Number of attempts: 1 Airway Equipment and Method: Stylet Placement Confirmation: ETT inserted through vocal cords under direct vision,  positive ETCO2,  CO2 detector and breath sounds checked- equal and bilateral Secured at: 20 cm Tube secured with: Tape Dental Injury: Teeth and Oropharynx as per pre-operative assessment

## 2017-06-23 NOTE — Anesthesia Preprocedure Evaluation (Signed)
Anesthesia Evaluation  Patient identified by MRN, date of birth, ID band Patient awake    Reviewed: Allergy & Precautions, H&P , NPO status , Patient's Chart, lab work & pertinent test results  Airway Mallampati: II   Neck ROM: full    Dental   Pulmonary former smoker,    breath sounds clear to auscultation       Cardiovascular hypertension,  Rhythm:regular Rate:Normal     Neuro/Psych Trigeminal neuralgia  Neuromuscular disease    GI/Hepatic GERD  ,  Endo/Other    Renal/GU      Musculoskeletal   Abdominal   Peds  Hematology   Anesthesia Other Findings   Reproductive/Obstetrics                             Anesthesia Physical Anesthesia Plan  ASA: II  Anesthesia Plan: General   Post-op Pain Management:    Induction: Intravenous  PONV Risk Score and Plan: 3 and Ondansetron, Dexamethasone, Midazolam and Treatment may vary due to age or medical condition  Airway Management Planned: Oral ETT  Additional Equipment:   Intra-op Plan:   Post-operative Plan: Extubation in OR  Informed Consent: I have reviewed the patients History and Physical, chart, labs and discussed the procedure including the risks, benefits and alternatives for the proposed anesthesia with the patient or authorized representative who has indicated his/her understanding and acceptance.     Plan Discussed with: CRNA, Anesthesiologist and Surgeon  Anesthesia Plan Comments:         Anesthesia Quick Evaluation

## 2017-06-23 NOTE — Op Note (Signed)
PRE-OPERATIVE DIAGNOSIS:  Uterine Fibroids    POST-OPERATIVE DIAGNOSIS:  Uterine Fibroids   PROCEDURE:  TOTAL ABDOMINAL HYSTERECTOMY, BILATERAL SALPINGECTOMY  SURGEON: Elveria Royals, MD  ASSISTANT:  Tiana Loft, MD   ANESTHESIA:  General endotracheal  EBL: 300 cc  IVF: 2300 cc  LR   Urine output: urine in foley  100 cc clear   BLOOD ADMINISTERED: None   LOCAL MEDICATIONS USED:  MARCAINE 0.25% 10 cc skin infiltration     SPECIMEN:  Uterus, cervix, both fallopian tubes.  781 gms,   DISPOSITION OF SPECIMEN:  PATHOLOGY  COUNTS:  YES  PATIENT DISPOSITION:  PACU - hemodynamically stable. Then admit for post-op care.   Delay start of Pharmacological VTE agent (>24hrs) due to surgical blood loss or risk of bleeding: yes  PROCEDURE:   Indication:  Symptomatic uterine fibroids with abdominal pressure, pain and menorrhagia. Risks and complications of surgery including infection, bleeding, damage to internal organs and other including but not limited to surgery related problems including pneumonia, VTE reviewed. Informed written consent was obtained.  Informed written consent obtained for total abdominal hysterectomy and bilateral salpingectomy.  Patient was brought to the operating room with IV running. Time out carried out and patient and procedure confirrned, She received 2 gm Ancef. Underwent general anesthesia without difficulty and was given dorsal supine position, prepped and draped in sterile fashion. Foley catheter was placed. Exam under anesthesia noted uterus just below the the umbilicus and not mobile. Pfannenstiel incision was made with scalpel and carried down to the underlying fascia with Bovie with excellent hemostasis.  Fascia incised and extended laterally. Fascia grasped with Kocher's and underlying rectus muscles were dissected down. Rectus muscles were separated in midline. Posterior rectus sheath and posterior peritoneum was grasped with mosquitoes and peritoneal  entry made.  A large fibroid uterus was palpated with minimal mobility due to anterior and posterior fibroids and prominent sacral promontory. No adhesions noted. Upper abdomen felt normal. Uterus could not be delivered out of the incision. Bowel packed with moist sponges and Balfour retractor placed securely. Bilateral round ligaments clamped and cut and suture transfixed with 0-Vicryl and anterior uterovesical fold of peritoneum dissected to push the bladder down. Uterine fundus was grasped with tenaculum and uterus was now manipulated out of the incision. Fallopian tubes and both ovaries appear normal. A window was created in posterior broad ligament and right fallopian tube and right utero-ovarian ligament was clamped with Heaney clamps x 2 and cut in between. Pedicle was suture transfixed with 0-Vicryl. Similarly left tube and ovary were dissected off the uterus and pedicle suture transfixed. Posterior broad ligament was dissected bilaterally. Posterior myoma was protruding just above the isthmus area. Uterine vessels were skeletonized. Bilateral uterine vessels were clamped with Heaney clamps and cut and transfixed with 0 Vicryl. Uterus was amputated above the cervix and passed off and cervical stump was grasped with Kochers.  Straight Heaney clamps applied bilaterally, Cardinal ligaments cut and transfixed with 0 Vicryl. Bilateral curved Heaney's applied to vaginal angles with Uterosacral ligament and cut and transfixed. Vaginal opening noted, was cut circumferentially. Vaginal cut was closed from one angle to the other with 0 Vicryl interlocking sutures with excellent hemostasis. Bilateral salpingectomy performed by placing Kelley clamps on mesosalpinx, excising the tube and suture transfixing the pedicle with 2-0 Vicryl. All pedicles were dry. Ovaries looked hemostatic and normal. Suction irrigation done. Balfour retractor and packed lap sponges removed. Peritoneal edges grasped and peritoneum closed with  2-0 Vicryl. Fascia sutured with  0 Vicryl. Subcutaneous layer was deep and closed with 2-0 Plain gut. Skin approximated with 4-0 Vicryl in subcuticular fashion.  Sterile dressing placed.  Sterile Honeycomb dressing placed.  All  Instruments/ lap/ sponges counts were correct x2. No complications.  Dr Benjie Karvonen was the surgeon for entire case.   V.Thania Woodlief, MD

## 2017-06-24 ENCOUNTER — Encounter (HOSPITAL_COMMUNITY): Payer: Self-pay | Admitting: Obstetrics & Gynecology

## 2017-06-24 LAB — CBC
HCT: 29.7 % — ABNORMAL LOW (ref 36.0–46.0)
HEMOGLOBIN: 9.9 g/dL — AB (ref 12.0–15.0)
MCH: 29.1 pg (ref 26.0–34.0)
MCHC: 33.3 g/dL (ref 30.0–36.0)
MCV: 87.4 fL (ref 78.0–100.0)
Platelets: 180 10*3/uL (ref 150–400)
RBC: 3.4 MIL/uL — AB (ref 3.87–5.11)
RDW: 15.1 % (ref 11.5–15.5)
WBC: 8.8 10*3/uL (ref 4.0–10.5)

## 2017-06-24 MED ORDER — SIMETHICONE 80 MG PO CHEW
80.0000 mg | CHEWABLE_TABLET | Freq: Four times a day (QID) | ORAL | Status: DC | PRN
  Administered 2017-06-24: 80 mg via ORAL
  Filled 2017-06-24: qty 1

## 2017-06-24 MED ORDER — IBUPROFEN 600 MG PO TABS
600.0000 mg | ORAL_TABLET | Freq: Four times a day (QID) | ORAL | Status: DC | PRN
  Administered 2017-06-25: 600 mg via ORAL
  Filled 2017-06-24 (×2): qty 1

## 2017-06-24 MED ORDER — OXYCODONE-ACETAMINOPHEN 5-325 MG PO TABS
1.0000 | ORAL_TABLET | Freq: Four times a day (QID) | ORAL | Status: DC | PRN
  Administered 2017-06-24 – 2017-06-25 (×2): 1 via ORAL
  Filled 2017-06-24 (×3): qty 1

## 2017-06-24 NOTE — Anesthesia Postprocedure Evaluation (Signed)
Anesthesia Post Note  Patient: Jamie James  Procedure(s) Performed: HYSTERECTOMY ABDOMINAL WITH SALPINGECTOMY (Bilateral Abdomen)     Patient location during evaluation: PACU Anesthesia Type: General Level of consciousness: awake and alert Pain management: pain level controlled Vital Signs Assessment: post-procedure vital signs reviewed and stable Respiratory status: spontaneous breathing, nonlabored ventilation, respiratory function stable and patient connected to nasal cannula oxygen Cardiovascular status: blood pressure returned to baseline and stable Postop Assessment: no apparent nausea or vomiting Anesthetic complications: no    Last Vitals:  Vitals:   06/24/17 0536 06/24/17 0804  BP: 110/68 106/68  Pulse: 61 61  Resp: 15 16  Temp: 36.7 C 37.1 C  SpO2: 100% 98%    Last Pain:  Vitals:   06/24/17 0900  TempSrc:   PainSc: [place]

## 2017-06-24 NOTE — Progress Notes (Signed)
1 Day Post-Op Procedure(s) (LRB): HYSTERECTOMY ABDOMINAL WITH SALPINGECTOMY (Bilateral)  Subjective: Patient reports incisional pain, tolerating PO and no problems voiding.    Objective: I have reviewed patient's vital signs, intake and output, medications and labs.  General: alert and cooperative Resp: clear to auscultation bilaterally Cardio: regular rate and rhythm, S1, S2 normal, no murmur, click, rub or gallop GI: soft, non-tender; bowel sounds normal; no masses,  no organomegaly and incision: dressing dry Extremities: extremities normal, atraumatic, no cyanosis or edema  Assessment: s/p Procedure(s) with comments: HYSTERECTOMY ABDOMINAL WITH SALPINGECTOMY (Bilateral) - 2 hrs.: stable, progressing well, tolerating diet and ambulating Surgical findings reviewed  Plan:  LOS: 1 day  Encourage ambulation Advance to PO medication Discontinue IV fluids Simethicon Anticipate d/c tomorrow   Jamie James 06/24/2017, 1:30 PM

## 2017-06-25 MED ORDER — SIMETHICONE 80 MG PO CHEW: 80.0000 mg | CHEWABLE_TABLET | Freq: Four times a day (QID) | ORAL | 0 refills | Status: DC | PRN

## 2017-06-25 MED ORDER — IBUPROFEN 600 MG PO TABS: 600.0000 mg | ORAL_TABLET | Freq: Four times a day (QID) | ORAL | 0 refills | Status: DC | PRN

## 2017-06-25 MED ORDER — OXYCODONE-ACETAMINOPHEN 5-325 MG PO TABS: 1.0000 | ORAL_TABLET | Freq: Four times a day (QID) | ORAL | 0 refills | Status: DC | PRN

## 2017-06-25 NOTE — Discharge Summary (Signed)
Physician Discharge Summary  Patient ID: Jamie James MRN: [id number] DOB/AGE: December 21, 1975 42 y.o.  Admit date: 06/23/2017 Discharge date: 06/25/2017  Admission Diagnoses: Fibroids   Discharge Diagnoses:  Principal Problem:   Uterine fibroid Active Problems:   S/P abdominal hysterectomy bilateral salpingectomy  Discharged Condition: good  Hospital Course: Uncomplicated  Discharge Exam: Blood pressure 108/74, pulse (!) 55, temperature 98.3 F (36.8 C), temperature source Oral, resp. rate 16, height 5\' 4"  (1.626 m), weight 193 lb (87.5 kg), last menstrual period 05/28/2017, SpO2 97 %. See progress note. CBC    Component Value Date/Time   WBC 8.8 06/24/2017 0531   RBC 3.40 (L) 06/24/2017 0531   HGB 9.9 (L) 06/24/2017 0531   HCT 29.7 (L) 06/24/2017 0531   PLT 180 06/24/2017 0531   MCV 87.4 06/24/2017 0531   MCH 29.1 06/24/2017 0531   MCHC 33.3 06/24/2017 0531   RDW 15.1 06/24/2017 0531   LYMPHSABS 1.1 07/05/2007 0900   MONOABS 1.0 07/05/2007 0900   EOSABS 0.0 07/05/2007 0900   BASOSABS 0.0 07/05/2007 0900    Disposition: 01-Home or Self Care  Discharge Instructions    Call MD for:   Complete by:  As directed    Vaginal bleeding more than just few spots of blood   Call MD for:  difficulty breathing, headache or visual disturbances   Complete by:  As directed    Call MD for:  hives   Complete by:  As directed    Call MD for:  persistant dizziness or light-headedness   Complete by:  As directed    Call MD for:  persistant nausea and vomiting   Complete by:  As directed    Call MD for:  redness, tenderness, or signs of infection (pain, swelling, redness, odor or green/yellow discharge around incision site)   Complete by:  As directed    Call MD for:  severe uncontrolled pain   Complete by:  As directed    Call MD for:  temperature >100.4   Complete by:  As directed    Diet - low sodium heart healthy   Complete by:  As directed    Discharge wound care:    Complete by:  As directed    Remove dressing on 7th day after surgery   Increase activity slowly   Complete by:  As directed      Allergies as of 06/25/2017   No Known Allergies     Medication List    TAKE these medications   B-12 PO Take 1 capsule by mouth daily.   BC HEADACHE PO Take 1 packet by mouth daily as needed (headaches).   carbamazepine 200 MG 12 hr capsule Commonly known as:  CARBATROL Take 400 mg by mouth 3 times daily   ibuprofen 600 MG tablet Commonly known as:  ADVIL,MOTRIN Take 1 tablet (600 mg total) by mouth every 6 (six) hours as needed for moderate pain.   IRON PO Take 1 tablet by mouth daily.   lisinopril-hydrochlorothiazide 20-12.5 MG tablet Commonly known as:  PRINZIDE,ZESTORETIC Take 1 tablet by mouth once daily   oxyCODONE-acetaminophen 5-325 MG tablet Commonly known as:  PERCOCET/ROXICET Take 1-2 tablets by mouth every 6 (six) hours as needed for severe pain.   simethicone 80 MG chewable tablet Commonly known as:  MYLICON Chew 1 tablet (80 mg total) by mouth 4 (four) times daily as needed for flatulence.   VITAMIN C PO Take 1 tablet by mouth daily.   VITAMIN D PO Take 1  capsule by mouth daily.            Discharge Care Instructions  (From admission, onward)        Start     Ordered   06/25/17 0000  Discharge wound care:    Comments:  Remove dressing on 7th day after surgery   06/25/17 1045       Signed: Elveria Royals 06/25/2017, 10:47 AM

## 2017-06-25 NOTE — Progress Notes (Signed)
2 Days Post-Op Procedure(s) (LRB): HYSTERECTOMY ABDOMINAL WITH SALPINGECTOMY (Bilateral)  Subjective: Patient reports feeling well. Incisional and gas pain but managing well. Reg diet. Ambulating. Voiding. No CP/SOB/LE pain   Objective: I have reviewed patient's vital signs, intake and output, medications and labs. BP 108/74 (BP Location: Left Arm)   Pulse (!) 55   Temp 98.3 F (36.8 C) (Oral)   Resp 16   Ht 5\' 4"  (1.626 m)   Wt 193 lb (87.5 kg)   LMP 05/28/2017 (Approximate)   SpO2 97%   BMI 33.13 kg/m   General: alert and cooperative Resp: clear to auscultation bilaterally Cardio: regular rate and rhythm, S1, S2 normal, no murmur, click, rub or gallop GI: soft, non-tender; bowel sounds normal; no masses,  no organomegaly and incision: dressing dry Extremities: extremities normal, atraumatic, no cyanosis or edema  Assessment: POD#2 s/p Procedure(s) with comments: HYSTERECTOMY ABDOMINAL WITH SALPINGECTOMY (Bilateral) - 2 hrs.: stable, progressing well, tolerating diet and ambulating Surgical findings reviewed  Plan:  LOS: 2 days  Discharge home. Post-op care reviewed. Warning signs discussed F/up Dr Benjie Karvonen 2 wks.   Jamie James 06/25/2017, 10:38 AM

## 2017-06-25 NOTE — Progress Notes (Signed)
Discharge instructions given. Written copy of AVS along with Rx given to patient.

## 2017-06-25 NOTE — Discharge Instructions (Signed)
Abdominal Hysterectomy Abdominal hysterectomy is a surgery to remove your womb (uterus). The womb is the part of your body that holds a growing baby. You may need this procedure if:  You have cancer.  You have growths (tumors or fibroids) in your uterus.  You have long-term (chronic) pain.  You are bleeding.  Your womb has slipped down into your vagina.  You have a condition in which the tissue that lines the womb grows outside of its normal place.  You have an infection in your womb.  You have problems with your period.  You may also need other reproductive parts removed. This will depend on why you need to have the surgery. What happens before the procedure? Staying hydrated Follow instructions from your doctor about hydration. This may include:  Up to 2 hours before the procedure - you may continue to drink clear liquids, such as: ? Water. ? Fruit juice. ? Black coffee. ? Plain tea.  Eating and drinking restrictions Follow instructions from your doctor about eating and drinking. These may include:  8 hours before the procedure - stop eating heavy meals or foods, such as: ? Meat. ? Fried foods. ? Fatty foods.  6 hours before the procedure - stop eating light meals or foods, such as: ? Toast. ? Cereal.  6 hours before the procedure - stop drinking: ? Milk ? Drinks that have milk in them.  2 hours before the procedure - stop drinking clear liquids.  Medicines  Ask your doctor about: ? Changing or stopping your normal medicines. This is important if you take diabetes medicines or blood thinners. ? Taking medicines such as aspirin and ibuprofen. These medicines can thin your blood. Do not take these medicines before your procedure if your doctor tells you not to.  You may be given antibiotic medicine. This can help prevent infection.  You may be asked to take medicines that help you poop (laxatives). General instructions  Ask your doctor how your surgical site  will be marked or identified.  You may be asked to shower with a germ-killing soap.  Plan to have someone take you home from the hospital.  Do not use any products that contain nicotine or tobacco, such as cigarettes and e-cigarettes. If you need help quitting, ask your doctor.  You may have an exam or tests done.  You may have a blood or urine sample taken.  You may need to have an enema to clean out your rectum and lower colon.  Talk to your doctor about the changes this procedure may cause. These can be physical or emotional. What happens during the procedure?  To lower your risk of infection: ? Your health care team will wash or clean their hands. ? Your skin will be washed with soap. ? Hair may be removed from the surgical area.  An IV tube will be put into one of your veins.  You will be given one or more of the following: ? A medicine to help you relax (sedative). ? A medicine to make you fall asleep (general anesthetic).  Tight-fitting (compression) stockings will be placed on your legs to help with circulation.  A thin, flexible tube (catheter) will be inserted to help drain your urine.  The doctor will make a cut (incision) through the skin in your lower belly. It may go side-to-side or up-and-down.  The doctor will move the body tissues that cover your womb.  The doctor will remove your womb.  The doctor may take  out any other parts that need to be removed.  The doctor will control the bleeding.  The doctor will close your cut with stitches (sutures), skin glue, or adhesive strips.  A bandage (dressing) will be placed over the cut. The procedure may vary among doctors and hospitals. What happens after the procedure?  You will be given pain medicine if you need it.  Your blood pressure, heart rate, breathing rate, and blood oxygen level will be watched until the medicines you were given have worn off.  You will need to stay in the hospital to recover. Ask  your doctor how long you will need to stay in the hospital after your procedure.  You may have a liquid diet at first. You will most likely return to your usual diet the day after surgery.  You will still have the urinary catheter in place. It will likely be removed the day after surgery.  You may have to wear compression stockings. These stockings help to prevent blood clots and reduce swelling in your legs.  You will be encouraged to walk as soon as possible. You will also use a device or do breathing exercises to keep your lungs clear.  You may need to use a sanitary pad for vaginal discharge. Summary  Abdominal hysterectomy is a surgery to remove your womb (uterus). The womb is the part of your body that holds a growing baby.  Talk to your doctor about the changes this procedure may cause. These can be physical or emotional.  You will be given pain medicine if you need it.  You will need to stay in the hospital to recover for one to two days. Ask your doctor how long you will need to stay in the hospital after your procedure. This information is not intended to replace advice given to you by your health care provider. Make sure you discuss any questions you have with your health care provider. Document Released: 05/01/2013 Document Revised: 04/14/2016 Document Reviewed: 04/14/2016 Elsevier Interactive Patient Education  2017 [employer].

## 2017-06-25 NOTE — Progress Notes (Signed)
Discharged home in stable condition via w/c. 

## 2017-12-10 ENCOUNTER — Emergency Department (HOSPITAL_COMMUNITY)
Admission: EM | Admit: 2017-12-10 | Discharge: 2017-12-10 | Disposition: A | Discharge status: Home / Self Care | Payer: 59 | Attending: Emergency Medicine | Admitting: Emergency Medicine

## 2017-12-10 ENCOUNTER — Other Ambulatory Visit: Payer: Self-pay

## 2017-12-10 ENCOUNTER — Encounter (HOSPITAL_COMMUNITY): Payer: Self-pay

## 2017-12-10 ENCOUNTER — Emergency Department (HOSPITAL_COMMUNITY): Payer: 59

## 2017-12-10 DIAGNOSIS — M542 Cervicalgia: Secondary | ICD-10-CM | POA: Diagnosis not present

## 2017-12-10 DIAGNOSIS — Z87891 Personal history of nicotine dependence: Secondary | ICD-10-CM | POA: Diagnosis not present

## 2017-12-10 MED ORDER — CYCLOBENZAPRINE HCL 10 MG PO TABS: 10.0000 mg | ORAL_TABLET | Freq: Three times a day (TID) | ORAL | 0 refills | Status: DC

## 2017-12-10 MED ORDER — DIAZEPAM 5 MG/ML IJ SOLN
5.0000 mg | Freq: Once | INTRAMUSCULAR | Status: AC
  Administered 2017-12-10: 5 mg via INTRAVENOUS
  Filled 2017-12-10: qty 2

## 2017-12-10 MED ORDER — HYDROCODONE-ACETAMINOPHEN 5-325 MG PO TABS: 1.0000 | ORAL_TABLET | Freq: Four times a day (QID) | ORAL | 0 refills | Status: DC | PRN

## 2017-12-10 MED ORDER — IBUPROFEN 800 MG PO TABS: 800.0000 mg | ORAL_TABLET | Freq: Three times a day (TID) | ORAL | 0 refills | Status: DC

## 2017-12-10 MED ORDER — KETOROLAC TROMETHAMINE 30 MG/ML IJ SOLN
30.0000 mg | Freq: Once | INTRAMUSCULAR | Status: AC
  Administered 2017-12-10: 30 mg via INTRAVENOUS
  Filled 2017-12-10: qty 1

## 2017-12-10 MED ORDER — HYDROMORPHONE HCL 1 MG/ML IJ SOLN
0.5000 mg | Freq: Once | INTRAMUSCULAR | Status: AC
  Administered 2017-12-10: 0.5 mg via INTRAVENOUS
  Filled 2017-12-10: qty 1

## 2017-12-10 NOTE — Discharge Instructions (Signed)
Rest at home until Tuesday and follow-up with your family doctor next week

## 2017-12-10 NOTE — ED Provider Notes (Signed)
[facility] DEPT Provider Note   CSN: [id number] Arrival date & time: 12/10/17  0758     History   Chief Complaint Chief Complaint  Patient presents with  . Neck Pain    HPI Jamie James is a 42 y.o. female.  Patient complains of left-sided neck pain.  She woke up with her neck hurting like this  The history is provided by the patient. No language interpreter was used.  Neck Pain   This is a new problem. The current episode started 12 to 24 hours ago. The problem occurs constantly. The problem has not changed since onset.The pain is associated with nothing. There has been no fever. Pain location: Left-sided neck pain. The pain is at a severity of 5/10. The pain is moderate. The symptoms are aggravated by bending. Pertinent negatives include no photophobia, no chest pain and no headaches.    Past Medical History:  Diagnosis Date  . Anemia   . Fibroid, uterine   . GERD (gastroesophageal reflux disease)    occasional -diet controlled  . Hypertension   . SVD (spontaneous vaginal delivery)    x 3  . Trigeminal neuralgia   . Uterine fibroid 06/23/2017    Patient Active Problem List   Diagnosis Date Noted  . Uterine fibroid 06/23/2017  . S/P abdominal hysterectomy 06/23/2017    Past Surgical History:  Procedure Laterality Date  . HYSTERECTOMY ABDOMINAL WITH SALPINGECTOMY Bilateral 06/23/2017   Procedure: HYSTERECTOMY ABDOMINAL WITH SALPINGECTOMY;  Surgeon: Azucena Fallen, MD;  Location: [facility];  Service: Gynecology;  Laterality: Bilateral;  2 hrs.     OB History   None      Home Medications    Prior to Admission medications   Medication Sig Start Date End Date Taking? Authorizing Provider  carbamazepine (CARBATROL) 200 MG 12 hr capsule Take 400 mg by mouth 3 times daily 05/25/17  Yes [provider]  lisinopril-hydrochlorothiazide (PRINZIDE,ZESTORETIC) 20-12.5 MG tablet Take 1 tablet by mouth once daily 06/09/17  Yes  [provider]  cyclobenzaprine (FLEXERIL) 10 MG tablet Take 1 tablet (10 mg total) by mouth 3 (three) times daily. 12/10/17   Milton Ferguson, MD  HYDROcodone-acetaminophen (NORCO/VICODIN) 5-325 MG tablet Take 1 tablet by mouth every 6 (six) hours as needed for moderate pain. 12/10/17   Milton Ferguson, MD  ibuprofen (ADVIL,MOTRIN) 800 MG tablet Take 1 tablet (800 mg total) by mouth 3 (three) times daily. 12/10/17   Milton Ferguson, MD  oxyCODONE-acetaminophen (PERCOCET/ROXICET) 5-325 MG tablet Take 1-2 tablets by mouth every 6 (six) hours as needed for severe pain. Patient not taking: Reported on 12/10/2017 06/25/17   Azucena Fallen, MD  simethicone (MYLICON) 80 MG chewable tablet Chew 1 tablet (80 mg total) by mouth 4 (four) times daily as needed for flatulence. Patient not taking: Reported on 12/10/2017 06/25/17   Azucena Fallen, MD    Family History No family history on file.  Social History Social History   Tobacco Use  . Smoking status: Former Smoker    Packs/day: 0.25    Years: 15.00    Pack years: 3.75    Types: Cigarettes    Last attempt to quit: 05/10/2006    Years since quitting: 11.5  . Smokeless tobacco: Never Used  Substance Use Topics  . Alcohol use: Yes    Comment: socially  . Drug use: No     Allergies   Patient has no known allergies.   Review of Systems Review of Systems  Constitutional: Negative for  appetite change and fatigue.  HENT: Negative for congestion, ear discharge and sinus pressure.   Eyes: Negative for photophobia and discharge.  Respiratory: Negative for cough.   Cardiovascular: Negative for chest pain.  Gastrointestinal: Negative for abdominal pain and diarrhea.  Genitourinary: Negative for frequency and hematuria.  Musculoskeletal: Positive for neck pain. Negative for back pain.  Skin: Negative for rash.  Neurological: Negative for seizures and headaches.  Psychiatric/Behavioral: Negative for hallucinations.     Physical Exam Updated  Vital Signs BP (!) 148/103   Pulse 91   Temp 98.2 F (36.8 C) (Oral)   Resp 16   LMP 05/28/2017 (Approximate)   SpO2 100%   Physical Exam  Constitutional: She is oriented to person, place, and time. She appears well-developed.  HENT:  Head: Normocephalic.  Tender left lateral neck  Eyes: Conjunctivae and EOM are normal. No scleral icterus.  Neck: Neck supple. No thyromegaly present.  Cardiovascular: Normal rate and regular rhythm. Exam reveals no gallop and no friction rub.  No murmur heard. Pulmonary/Chest: No stridor. She has no wheezes. She has no rales. She exhibits no tenderness.  Abdominal: She exhibits no distension. There is no tenderness. There is no rebound.  Musculoskeletal: Normal range of motion. She exhibits no edema.  Lymphadenopathy:    She has no cervical adenopathy.  Neurological: She is oriented to person, place, and time. She exhibits normal muscle tone. Coordination normal.  Skin: No rash noted. No erythema.  Psychiatric: She has a normal mood and affect. Her behavior is normal.     ED Treatments / Results  Labs (all labs ordered are listed, but only abnormal results are displayed) Labs Reviewed - No data to display  EKG None  Radiology Dg Cervical Spine Complete  Result Date: 12/10/2017 CLINICAL DATA:  Sudden excruciating pain at LEFT neck, persisting. No recent illness or trauma. EXAM: CERVICAL SPINE - COMPLETE 4+ VIEW COMPARISON:  None. FINDINGS: Mild levoscoliosis which may be related to patient positioning. Slight reversal of the normal cervical spine lordosis is likely related to patient positioning or muscle spasm. No evidence of acute vertebral body subluxation. No fracture line or displaced fracture fragment. No acute or suspicious osseous lesion. No degenerative change. Prevertebral soft tissues are normal in thickness. The visualized paravertebral soft tissues are unremarkable. Incidental note is made of a rounded sclerotic density to the RIGHT  of midline at the C6 vertebral body level, likely superimposition of normal osseous structures at the level of the RIGHT pedicle or facets, not appreciated on other views and therefore of unlikely clinical significance. IMPRESSION: 1. No acute or significant findings. Mild scoliosis of the cervical spine which may be accentuated by patient positioning. Slight reversal of the normal cervical spine lordosis is likely related to patient positioning or muscle spasm. 2. Rounded sclerotic density to the RIGHT of midline at the C6 vertebral body level, seen on the AP view only and not reproduced on any other images suggesting benignity, most likely representing superimposition of normal osseous structures (pedicles and/or facets) and of unlikely clinical significance. No additional imaging recommended. At most, could consider follow-up plain film in 3-6 months to ensure stability or resolution. Electronically Signed   By: Franki Cabot M.D.   On: 12/10/2017 09:47    Procedures Procedures (including critical care time)  Medications Ordered in ED Medications  HYDROmorphone (DILAUDID) injection 0.5 mg (0.5 mg Intravenous Given 12/10/17 0907)  diazepam (VALIUM) injection 5 mg (5 mg Intravenous Given 12/10/17 0907)  ketorolac (TORADOL) 30 MG/ML  injection 30 mg (30 mg Intravenous Given 12/10/17 0907)  HYDROmorphone (DILAUDID) injection 0.5 mg (0.5 mg Intravenous Given 12/10/17 1209)     Initial Impression / Assessment and Plan / ED Course  I have reviewed the triage vital signs and the nursing notes.  Pertinent labs & imaging results that were available during my care of the patient were reviewed by me and considered in my medical decision making (see chart for details).     X-rays unremarkable.  Patient improved with pain medicine and muscle relaxers.  Diagnosis torticollis.  Patient will be sent home with Motrin and Flexeril Vicodin and will follow-up with her PCP  Final Clinical Impressions(s) / ED Diagnoses     Final diagnoses:  Neck pain    ED Discharge Orders        Ordered    ibuprofen (ADVIL,MOTRIN) 800 MG tablet  3 times daily     12/10/17 1310    cyclobenzaprine (FLEXERIL) 10 MG tablet  3 times daily     12/10/17 1310    HYDROcodone-acetaminophen (NORCO/VICODIN) 5-325 MG tablet  Every 6 hours PRN     12/10/17 1310       Milton Ferguson, MD 12/10/17 1316

## 2017-12-10 NOTE — ED Notes (Signed)
Patient is A & O x4.  She understood AVS instructions.

## 2017-12-10 NOTE — ED Notes (Signed)
After med admin, pt immediately laid down and went to sleep. Pt O2 was 89%. Place pt on 2L O2. Pt now 97% on 2L with even, unlabored resp. Pt husband at bedside

## 2017-12-10 NOTE — ED Triage Notes (Signed)
She states that as she was completing her routine to get ready to go to work, she experiences sudden, excruciating pain at left neck, which persists. She denies any recent illness nor trauma.

## 2017-12-10 NOTE — ED Notes (Signed)
Pt ambulatory to restroom

## 2018-04-27 ENCOUNTER — Encounter (HOSPITAL_COMMUNITY): Payer: Self-pay

## 2018-04-27 ENCOUNTER — Emergency Department (HOSPITAL_COMMUNITY)
Admission: EM | Admit: 2018-04-27 | Discharge: 2018-04-27 | Disposition: A | Discharge status: Home / Self Care | Payer: 59 | Attending: Emergency Medicine | Admitting: Emergency Medicine

## 2018-04-27 ENCOUNTER — Ambulatory Visit (HOSPITAL_COMMUNITY)
Admission: EM | Admit: 2018-04-27 | Discharge: 2018-04-27 | Disposition: A | Discharge status: Home / Self Care | Payer: 59 | Source: Home / Self Care

## 2018-04-27 ENCOUNTER — Other Ambulatory Visit: Payer: Self-pay

## 2018-04-27 DIAGNOSIS — Z87891 Personal history of nicotine dependence: Secondary | ICD-10-CM | POA: Insufficient documentation

## 2018-04-27 DIAGNOSIS — R51 Headache: Secondary | ICD-10-CM | POA: Insufficient documentation

## 2018-04-27 DIAGNOSIS — I10 Essential (primary) hypertension: Secondary | ICD-10-CM | POA: Insufficient documentation

## 2018-04-27 DIAGNOSIS — Z79899 Other long term (current) drug therapy: Secondary | ICD-10-CM | POA: Diagnosis not present

## 2018-04-27 LAB — CBC
HEMATOCRIT: 39.9 % (ref 36.0–46.0)
Hemoglobin: 12.8 g/dL (ref 12.0–15.0)
MCH: 28.6 pg (ref 26.0–34.0)
MCHC: 32.1 g/dL (ref 30.0–36.0)
MCV: 89.1 fL (ref 80.0–100.0)
NRBC: 0 % (ref 0.0–0.2)
PLATELETS: 186 10*3/uL (ref 150–400)
RBC: 4.48 MIL/uL (ref 3.87–5.11)
RDW: 13.6 % (ref 11.5–15.5)
WBC: 8.4 10*3/uL (ref 4.0–10.5)

## 2018-04-27 LAB — BASIC METABOLIC PANEL
Anion gap: 10 (ref 5–15)
BUN: 11 mg/dL (ref 6–20)
CALCIUM: 9.4 mg/dL (ref 8.9–10.3)
CO2: 26 mmol/L (ref 22–32)
Chloride: 100 mmol/L (ref 98–111)
Creatinine, Ser: 0.75 mg/dL (ref 0.44–1.00)
Glucose, Bld: 99 mg/dL (ref 70–99)
Potassium: 3.9 mmol/L (ref 3.5–5.1)
Sodium: 136 mmol/L (ref 135–145)

## 2018-04-27 NOTE — ED Notes (Signed)
E-signature not available, pt verbalized understanding of DC instructions  

## 2018-04-27 NOTE — ED Triage Notes (Signed)
Pt here for hypertension with hx of the same.  Is treated with lisinopril every morning.  Stated took BP at work multiple times throughout the day and had BPs in the 629B and 284 systolic.  No neuro deficits or complaints.  Stated just wanted to make sure BP was okay and can go back to work.  143/102 in triage.

## 2018-04-27 NOTE — ED Notes (Signed)
Pt denies any pain.

## 2018-04-27 NOTE — ED Provider Notes (Signed)
[facility] EMERGENCY DEPARTMENT Provider Note   CSN: [id number] Arrival date & time: 04/27/18  2001     History   Chief Complaint Chief Complaint  Patient presents with  . Hypertension    HPI Jamie James is a 42 y.o. female.  Patient with known history of high blood pressure.  Has been taking her lisinopril as usual.  No change in her meds.  Patient works as a Airline pilot.  Earlier today she had a throbbing headache to the left parietal area somewhat pinpoint when she would touch it would make it hurt worse.  That is now improved.  She noted that her blood pressures were high [id number] 518A systolic.  Her Co. workers got concerned upon arrival here systolic pressure was 416.  Patient without any strokelike symptoms no chest pain no shortness of breath.  Headache is now improved significantly.  No history of migraines.     Past Medical History:  Diagnosis Date  . Anemia   . Fibroid, uterine   . GERD (gastroesophageal reflux disease)    occasional -diet controlled  . Hypertension   . SVD (spontaneous vaginal delivery)    x 3  . Trigeminal neuralgia   . Uterine fibroid 06/23/2017    Patient Active Problem List   Diagnosis Date Noted  . Uterine fibroid 06/23/2017  . S/P abdominal hysterectomy 06/23/2017    Past Surgical History:  Procedure Laterality Date  . HYSTERECTOMY ABDOMINAL WITH SALPINGECTOMY Bilateral 06/23/2017   Procedure: HYSTERECTOMY ABDOMINAL WITH SALPINGECTOMY;  Surgeon: Azucena Fallen, MD;  Location: [facility];  Service: Gynecology;  Laterality: Bilateral;  2 hrs.     OB History   No obstetric history on file.      Home Medications    Prior to Admission medications   Medication Sig Start Date End Date Taking? Authorizing Provider  carbamazepine (CARBATROL) 200 MG 12 hr capsule Take 400 mg by mouth daily.  05/25/17  Yes [provider]  lisinopril-hydrochlorothiazide (PRINZIDE,ZESTORETIC) 20-12.5 MG tablet Take 1 tablet by  mouth once daily 06/09/17  Yes [provider]  cyclobenzaprine (FLEXERIL) 10 MG tablet Take 1 tablet (10 mg total) by mouth 3 (three) times daily. Patient not taking: Reported on 04/27/2018 12/10/17   Milton Ferguson, MD  cyclobenzaprine (FLEXERIL) 10 MG tablet Take 1 tablet (10 mg total) by mouth 3 (three) times daily. Patient not taking: Reported on 04/27/2018 12/10/17   Milton Ferguson, MD  HYDROcodone-acetaminophen (NORCO/VICODIN) 5-325 MG tablet Take 1 tablet by mouth every 6 (six) hours as needed for moderate pain. Patient not taking: Reported on 04/27/2018 12/10/17   Milton Ferguson, MD  ibuprofen (ADVIL,MOTRIN) 800 MG tablet Take 1 tablet (800 mg total) by mouth 3 (three) times daily. Patient not taking: Reported on 04/27/2018 12/10/17   Milton Ferguson, MD  ibuprofen (ADVIL,MOTRIN) 800 MG tablet Take 1 tablet (800 mg total) by mouth 3 (three) times daily. Patient not taking: Reported on 04/27/2018 12/10/17   Milton Ferguson, MD  oxyCODONE-acetaminophen (PERCOCET/ROXICET) 5-325 MG tablet Take 1-2 tablets by mouth every 6 (six) hours as needed for severe pain. Patient not taking: Reported on 12/10/2017 06/25/17   Azucena Fallen, MD  simethicone (MYLICON) 80 MG chewable tablet Chew 1 tablet (80 mg total) by mouth 4 (four) times daily as needed for flatulence. Patient not taking: Reported on 12/10/2017 06/25/17   Azucena Fallen, MD    Family History History reviewed. No pertinent family history.  Social History Social History   Tobacco Use  . Smoking status:  Former Smoker    Packs/day: 0.25    Years: 15.00    Pack years: 3.75    Types: Cigarettes    Last attempt to quit: 05/10/2006    Years since quitting: 11.9  . Smokeless tobacco: Never Used  Substance Use Topics  . Alcohol use: Yes    Comment: socially  . Drug use: No     Allergies   Patient has no known allergies.   Review of Systems Review of Systems  Constitutional: Negative for fever.  HENT: Negative for congestion.     Eyes: Negative for visual disturbance.  Respiratory: Negative for shortness of breath and wheezing.   Cardiovascular: Negative for chest pain.  Genitourinary: Negative for dysuria.  Musculoskeletal: Negative for back pain and neck pain.  Skin: Negative for rash.  Neurological: Positive for headaches. Negative for syncope, speech difficulty, weakness and numbness.  Hematological: Does not bruise/bleed easily.  Psychiatric/Behavioral: Negative for confusion.     Physical Exam Updated Vital Signs BP 127/89   Pulse (!) 55   Temp 98.2 F (36.8 C) (Oral)   Resp 16   LMP 05/28/2017 (Approximate)   SpO2 97%   Physical Exam Vitals signs and nursing note reviewed.  Constitutional:      General: She is not in acute distress.    Appearance: Normal appearance. She is not toxic-appearing.  HENT:     Head: Normocephalic and atraumatic.     Mouth/Throat:     Mouth: Mucous membranes are moist.  Eyes:     Extraocular Movements: Extraocular movements intact.     Conjunctiva/sclera: Conjunctivae normal.     Pupils: Pupils are equal, round, and reactive to light.  Neck:     Musculoskeletal: Normal range of motion and neck supple.  Cardiovascular:     Rate and Rhythm: Normal rate and regular rhythm.     Pulses: Normal pulses.     Heart sounds: Normal heart sounds.  Pulmonary:     Effort: Pulmonary effort is normal. No respiratory distress.     Breath sounds: Normal breath sounds.  Abdominal:     General: Bowel sounds are normal.     Palpations: Abdomen is soft.     Tenderness: There is no abdominal tenderness.  Musculoskeletal: Normal range of motion.        General: No swelling.  Skin:    General: Skin is warm.     Capillary Refill: Capillary refill takes less than 2 seconds.  Neurological:     General: No focal deficit present.     Mental Status: She is alert and oriented to person, place, and time.     Cranial Nerves: No cranial nerve deficit.     Sensory: No sensory deficit.      Motor: No weakness.     Coordination: Coordination normal.      ED Treatments / Results  Labs (all labs ordered are listed, but only abnormal results are displayed) Labs Reviewed  CBC  BASIC METABOLIC PANEL    EKG None  Radiology No results found.  Procedures Procedures (including critical care time)  Medications Ordered in ED Medications - No data to display   Initial Impression / Assessment and Plan / ED Course  I have reviewed the triage vital signs and the nursing notes.  Pertinent labs & imaging results that were available during my care of the patient were reviewed by me and considered in my medical decision making (see chart for details).     Blood pressure here with  signs of improvement.  Current pressure just recorded was 127/89.  Patient's headache has resolved.  Patient's has longstanding hypertension has been on lisinopril for a long period of time she takes it appropriately.  But have patient keep a hypertensive log.  Patient without any evidence of any endorgan dysfunction.  She will make an appointment to follow-up with her provider.  Final Clinical Impressions(s) / ED Diagnoses   Final diagnoses:  Essential hypertension    ED Discharge Orders    None       Fredia Sorrow, MD 04/28/18 2308

## 2018-04-27 NOTE — ED Notes (Signed)
Pt states she started having an abnormal headache this morning, states she checked her BP and it was very high, states its never been that high, headache getting worse and worse. Per Dr. Meda Coffee, pt needs eval in ER due to symptomatic HBP. Pt agreeable to plan, left with family member.

## 2018-04-27 NOTE — Discharge Instructions (Addendum)
Blood pressure here showing signs of improvement.  Keep taking her lisinopril.  Keep a daily log of your blood pressure and make an appointment to follow-up with your primary care provider.  Return for severe headache any strokelike symptoms chest pain or difficulty breathing.  Today's labs without any significant abnormality.

## 2018-04-27 NOTE — ED Notes (Signed)
Pt ambulated back to room with steady gait and no complaints.

## 2019-05-29 IMAGING — CR DG CERVICAL SPINE COMPLETE 4+V
5 series · 5 of 5 positions shown · non-contrast
Comparison: None.

CLINICAL DATA: Sudden excruciating pain at LEFT neck, persisting.
No recent illness or trauma.

EXAM:
CERVICAL SPINE - COMPLETE 4+ VIEW

[w cervical spine odontoid]
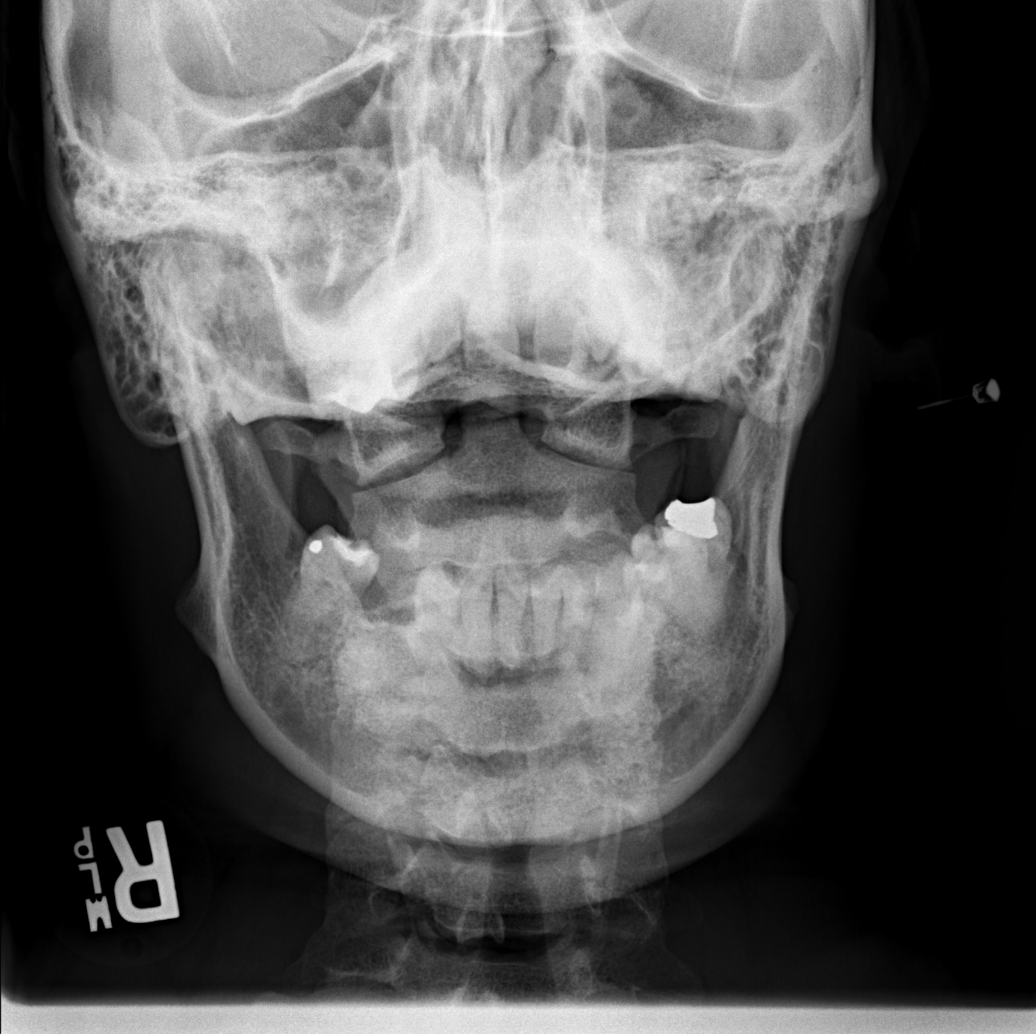

[w cervical spine ap]
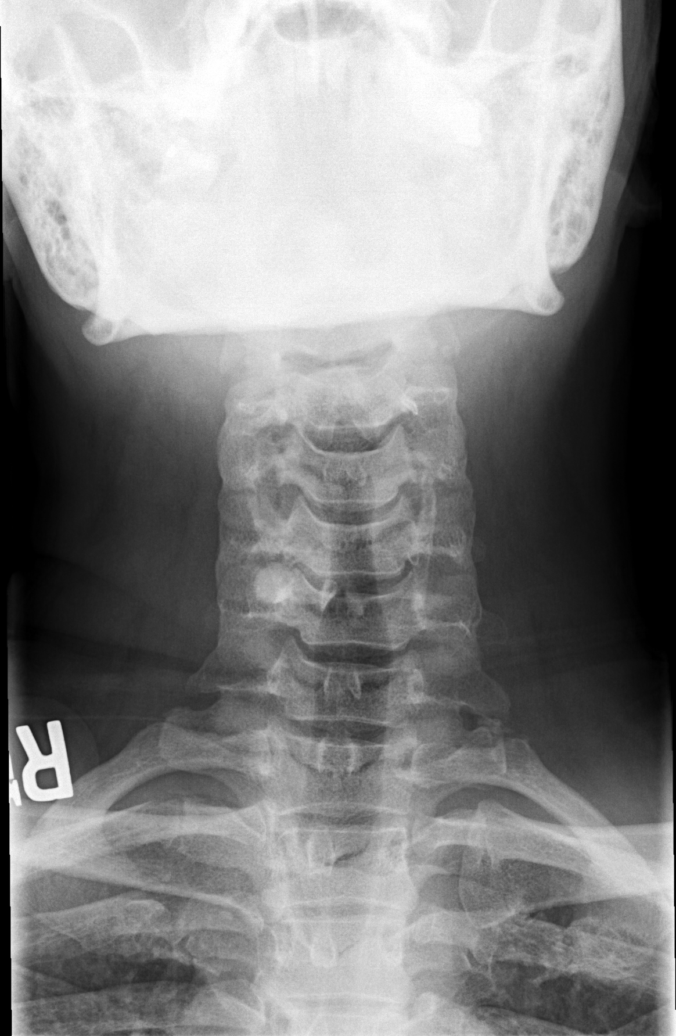

[w cervical spine ap_obl (1 of 2)]
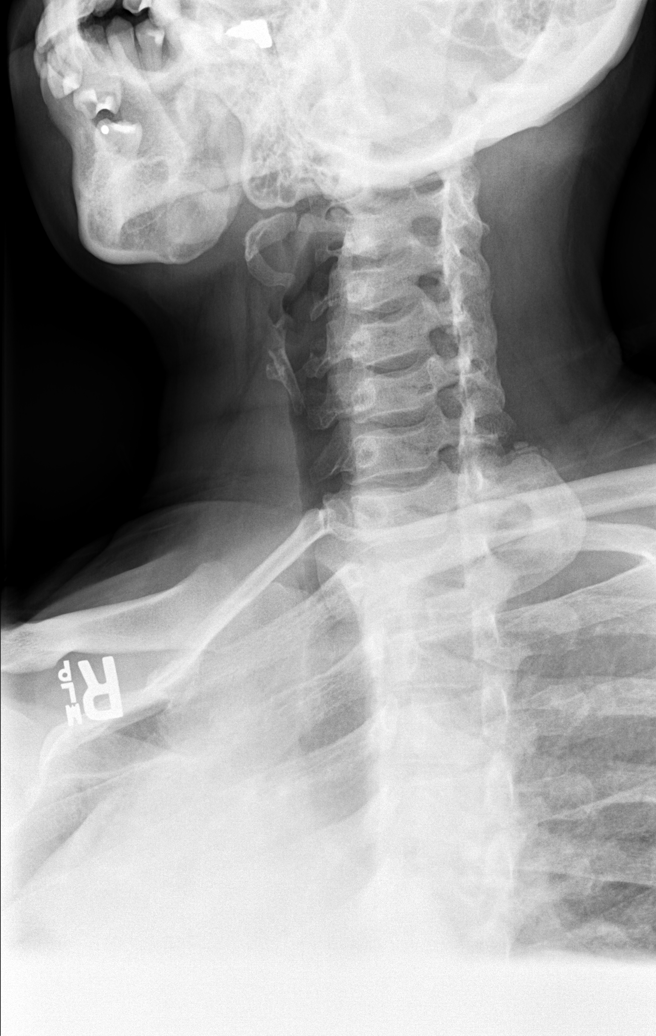

[w cervical spine ap_obl (2 of 2)]
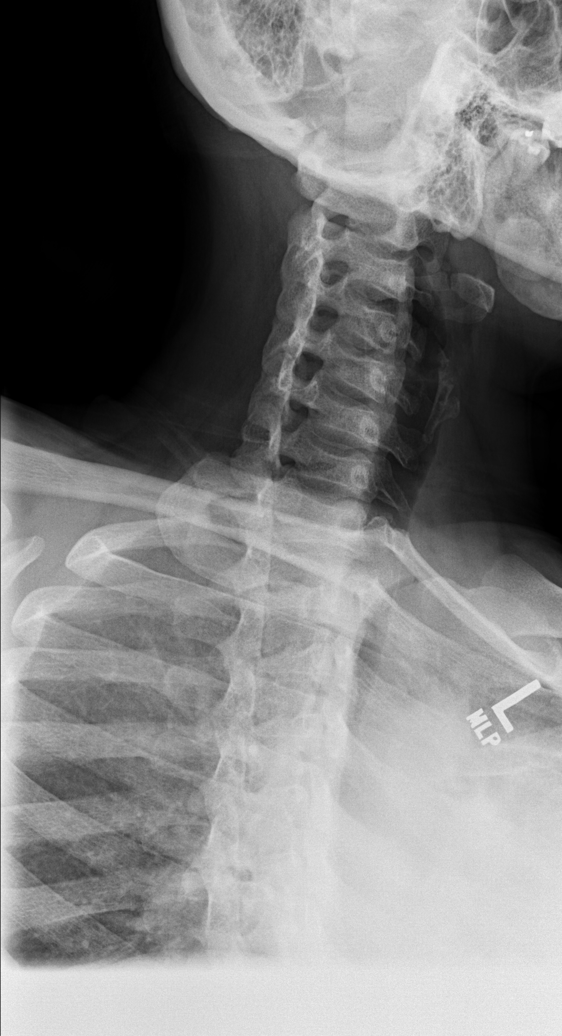

[w cervical spine lat]
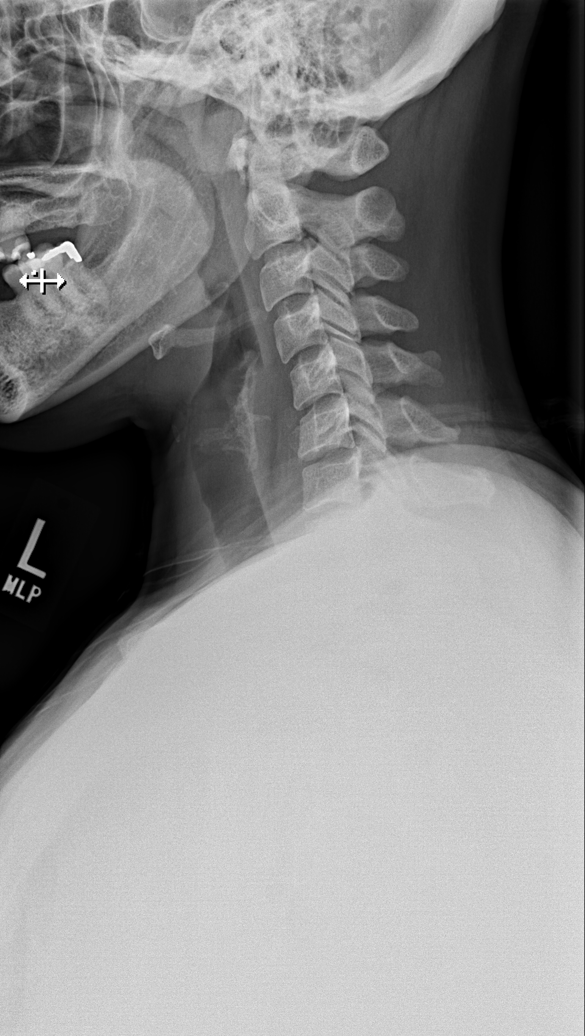

[5 of 5 positions shown; findings below may reference images not displayed]

FINDINGS: Mild levoscoliosis which may be related to patient positioning.
Slight reversal of the normal cervical spine lordosis is likely
related to patient positioning or muscle spasm. No evidence of acute
vertebral body subluxation. No fracture line or displaced fracture
fragment. No acute or suspicious osseous lesion. No degenerative
change.

Prevertebral soft tissues are normal in thickness. The visualized
paravertebral soft tissues are unremarkable.

Incidental note is made of a rounded sclerotic density to the RIGHT
of midline at the C6 vertebral body level, likely superimposition of
normal osseous structures at the level of the RIGHT pedicle or
facets, not appreciated on other views and therefore of unlikely
clinical significance.
IMPRESSION: 1. No acute or significant findings. Mild scoliosis of the cervical
spine which may be accentuated by patient positioning. Slight
reversal of the normal cervical spine lordosis is likely related to
patient positioning or muscle spasm.
2. Rounded sclerotic density to the RIGHT of midline at the C6
vertebral body level, seen on the AP view only and not reproduced on
any other images suggesting benignity, most likely representing
superimposition of normal osseous structures (pedicles and/or
facets) and of unlikely clinical significance. No additional imaging
recommended. At most, could consider follow-up plain film in 3-6
months to ensure stability or resolution.

## 2022-09-17 ENCOUNTER — Emergency Department (HOSPITAL_COMMUNITY)
Admission: EM | Admit: 2022-09-17 | Discharge: 2022-09-17 | Disposition: A | Discharge status: Home / Self Care | Payer: 59 | Attending: Emergency Medicine | Admitting: Emergency Medicine

## 2022-09-17 ENCOUNTER — Other Ambulatory Visit: Payer: Self-pay

## 2022-09-17 DIAGNOSIS — G500 Trigeminal neuralgia: Secondary | ICD-10-CM

## 2022-09-17 DIAGNOSIS — R519 Headache, unspecified: Secondary | ICD-10-CM | POA: Diagnosis present

## 2022-09-17 MED ORDER — GABAPENTIN 300 MG PO CAPS: 300.0000 mg | ORAL_CAPSULE | Freq: Three times a day (TID) | ORAL | 0 refills | Status: DC

## 2022-09-17 MED ORDER — OXYCODONE-ACETAMINOPHEN 5-325 MG PO TABS: 1.0000 | ORAL_TABLET | Freq: Four times a day (QID) | ORAL | 0 refills | Status: DC | PRN

## 2022-09-17 MED ORDER — OXYCODONE-ACETAMINOPHEN 5-325 MG PO TABS
2.0000 | ORAL_TABLET | Freq: Once | ORAL | Status: AC
  Administered 2022-09-17: 2 via ORAL
  Filled 2022-09-17: qty 2

## 2022-09-17 NOTE — ED Triage Notes (Signed)
Pt arrives c/o burning/aching pain to R side of face/jaw with intermittent "shocks". States that the last "attack" was Tuesday, but she has had intermittent issues since. Has been having increased issues for the past few weeks. States taking, chewing, swallowing causes the pain to get worse. Had similar issue to this about 15 years ago and was diagnosed with trigeminal neuralgia at that time. Was taking tegretol BID for a some time, but started taking 6 doses/daily when pain started worsening a couple weeks ago. Tegretol levels were checked by PCP and "high" so pt decreased to 4 doses/daily starting yesterday.

## 2022-09-17 NOTE — ED Provider Notes (Signed)
[place] EMERGENCY DEPARTMENT AT [facility] Provider Note   CSN: [id number] Arrival date & time: 09/17/22  1910     History  Chief Complaint  Patient presents with   Facial Pain    Jamie James is a 47 y.o. female.  47 year old female with history of trigeminal neuralgia who presents due to worsening right-sided face pain.  States that for the past week or so her symptoms have become worse.  She is taking Tegretol without relief.  Denies any fever or chills.  Patient states that she is able to drink liquids okay.       Home Medications Prior to Admission medications   Medication Sig Start Date End Date Taking? Authorizing Provider  carbamazepine (CARBATROL) 200 MG 12 hr capsule Take 400 mg by mouth daily.  05/25/17   [provider]  cyclobenzaprine (FLEXERIL) 10 MG tablet Take 1 tablet (10 mg total) by mouth 3 (three) times daily. Patient not taking: Reported on 04/27/2018 12/10/17   Bethann Berkshire, MD  cyclobenzaprine (FLEXERIL) 10 MG tablet Take 1 tablet (10 mg total) by mouth 3 (three) times daily. Patient not taking: Reported on 04/27/2018 12/10/17   Bethann Berkshire, MD  HYDROcodone-acetaminophen (NORCO/VICODIN) 5-325 MG tablet Take 1 tablet by mouth every 6 (six) hours as needed for moderate pain. Patient not taking: Reported on 04/27/2018 12/10/17   Bethann Berkshire, MD  ibuprofen (ADVIL,MOTRIN) 800 MG tablet Take 1 tablet (800 mg total) by mouth 3 (three) times daily. Patient not taking: Reported on 04/27/2018 12/10/17   Bethann Berkshire, MD  ibuprofen (ADVIL,MOTRIN) 800 MG tablet Take 1 tablet (800 mg total) by mouth 3 (three) times daily. Patient not taking: Reported on 04/27/2018 12/10/17   Bethann Berkshire, MD  lisinopril-hydrochlorothiazide [facility]) 20-12.5 MG tablet Take 1 tablet by mouth once daily 06/09/17   [provider]  oxyCODONE-acetaminophen (PERCOCET/ROXICET) 5-325 MG tablet Take 1-2 tablets by mouth every 6 (six) hours as  needed for severe pain. Patient not taking: Reported on 12/10/2017 06/25/17   Shea Evans, MD  simethicone (MYLICON) 80 MG chewable tablet Chew 1 tablet (80 mg total) by mouth 4 (four) times daily as needed for flatulence. Patient not taking: Reported on 12/10/2017 06/25/17   Shea Evans, MD      Allergies    Patient has no known allergies.    Review of Systems   Review of Systems  All other systems reviewed and are negative.   Physical Exam Updated Vital Signs BP (!) 151/97   Pulse 82   Temp 98.6 F (37 C) (Oral)   Resp 18   Ht 1.613 m (5' 3.5")   Wt 83 kg   LMP 05/28/2017 (Approximate)   SpO2 99%   BMI 31.91 kg/m  Physical Exam Vitals and nursing note reviewed.  Constitutional:      General: She is not in acute distress.    Appearance: Normal appearance. She is well-developed. She is not toxic-appearing.  HENT:     Head: Normocephalic and atraumatic.   Eyes:     General: Lids are normal.     Conjunctiva/sclera: Conjunctivae normal.     Pupils: Pupils are equal, round, and reactive to light.  Neck:     Thyroid: No thyroid mass.     Trachea: No tracheal deviation.  Cardiovascular:     Rate and Rhythm: Normal rate and regular rhythm.     Heart sounds: Normal heart sounds. No murmur heard.    No gallop.  Pulmonary:  Effort: Pulmonary effort is normal. No respiratory distress.     Breath sounds: Normal breath sounds. No stridor. No decreased breath sounds, wheezing, rhonchi or rales.  Abdominal:     General: There is no distension.     Palpations: Abdomen is soft.     Tenderness: There is no abdominal tenderness. There is no rebound.  Musculoskeletal:        General: No tenderness. Normal range of motion.     Cervical back: Normal range of motion and neck supple.  Skin:    General: Skin is warm and dry.     Findings: No abrasion or rash.  Neurological:     Mental Status: She is alert and oriented to person, place, and time. Mental status is at baseline.      GCS: GCS eye subscore is 4. GCS verbal subscore is 5. GCS motor subscore is 6.     Cranial Nerves: No cranial nerve deficit.     Sensory: No sensory deficit.     Motor: Motor function is intact.  Psychiatric:        Attention and Perception: Attention normal.        Speech: Speech normal.        Behavior: Behavior normal.     ED Results / Procedures / Treatments   Labs (all labs ordered are listed, but only abnormal results are displayed) Labs Reviewed - No data to display  EKG None  Radiology No results found.  Procedures Procedures    Medications Ordered in ED Medications - No data to display  ED Course/ Medical Decision Making/ A&P                             Medical Decision Making  Patient to be placed on gabapentin.  Will also give short course of Percocet and referral to neurology        Final Clinical Impression(s) / ED Diagnoses Final diagnoses:  None    Rx / DC Orders ED Discharge Orders     None         Lorre Nick, MD 09/17/22 [phone]

## 2022-09-21 ENCOUNTER — Encounter: Payer: Self-pay | Admitting: Neurology

## 2022-09-21 ENCOUNTER — Ambulatory Visit: Payer: 59 | Admitting: Neurology

## 2022-09-21 VITALS — BP 118/80 | HR 69 | Ht 63.0 in | Wt 177.0 lb

## 2022-09-21 DIAGNOSIS — G500 Trigeminal neuralgia: Secondary | ICD-10-CM | POA: Diagnosis not present

## 2022-09-21 MED ORDER — GABAPENTIN 300 MG PO CAPS: 900.0000 mg | ORAL_CAPSULE | Freq: Three times a day (TID) | ORAL | 11 refills | Status: DC

## 2022-09-21 MED ORDER — OXCARBAZEPINE 150 MG PO TABS: 300.0000 mg | ORAL_TABLET | Freq: Two times a day (BID) | ORAL | 11 refills | Status: DC

## 2022-09-21 NOTE — Progress Notes (Unsigned)
Chief Complaint  Patient presents with   New Patient (Initial Visit)    Rm 14. With husband, Harrington. Right sided facial pain, reports not being able to eat the last week and anything in her mouth triggers more pain, even straws. No medication is helping, just started gabapentin and thinks it may have been helpful but hasn't been on it long enough to say for sure it is helping. Cold compress has helped the most during the episodes      ASSESSMENT AND PLAN  Jamie James is a 47 y.o. female   Right trigeminal neuralgia  MRI brain w/wo  Higher dose of gabapentin 300mg  3 tabs tid, Trileptal 150mg  up to 2 bid   Vitamin D supplement   Return To Clinic With NP In 3 Months     DIAGNOSTIC DATA (LABS, IMAGING, TESTING) - I reviewed patient records, labs, notes, testing and imaging myself where available.   MEDICAL HISTORY:  Jamie James, seen in request by  [facility] NP Marva Panda, NP    I reviewed and summarized the referring note.PMHx. HTN Right trigeminal neuralgia  Since 2008, this is the first time   On May 10th 2024, tegretol 200mg  tid, l  1st flari up lasteidn, lasgintf 2 months,   Back donw only 2 tabs daily,    Until 3 weeks ago, 2 tid, level was high, back down, stop working,   [employer] schok here  Bgabpentin 300mg  tid, May 13, tegretol stop it totally yeaerstday,   Attack is not as longer, shorter, achy in the jaw does nto as long as beore,   Better, whoel right side, talkging, talking chewing, v3 more than v1, v2, jaw attach is donw burns an dache,      Past Medical History:  Diagnosis Date   Anemia    Fibroid, uterine    GERD (gastroesophageal reflux disease)    occasional -diet controlled   Hypertension    SVD (spontaneous vaginal delivery)    x 3   Trigeminal neuralgia    Uterine fibroid 06/23/2017   Past Surgical History:  Procedure Laterality Date   HYSTERECTOMY ABDOMINAL WITH SALPINGECTOMY Bilateral  06/23/2017   Procedure: HYSTERECTOMY ABDOMINAL WITH SALPINGECTOMY;  Surgeon: Shea Evans, MD;  Location: [facility];  Service: Gynecology;  Laterality: Bilateral;  2 hrs.     PHYSICAL EXAM:   Vitals:   09/21/22 1357  BP: 118/80  Pulse: 69  Weight: 177 lb (80.3 kg)  Height: 5\' 3"  (1.6 m)   Not recorded     Body mass index is 31.35 kg/m.  PHYSICAL EXAMNIATION:  Gen: NAD, conversant, well nourised, well groomed                     Cardiovascular: Regular rate rhythm, no peripheral edema, warm, nontender. Eyes: Conjunctivae clear without exudates or hemorrhage Neck: Supple, no carotid bruits. Pulmonary: Clear to auscultation bilaterally   NEUROLOGICAL EXAM:  MENTAL STATUS: Speech/cognition: Mild distress,  awake, alert, oriented to history taking and casual conversation CRANIAL NERVES: CN II: Visual fields are full to confrontation. Pupils are round equal and briskly reactive to light. CN III, IV, VI: extraocular movement are normal. No ptosis. CN V: Facial sensation is intact to light touch, corneal reflexes were normal bilaterally CN VII: Face is symmetric with normal eye closure  CN VIII: Hearing is normal to causal conversation. CN IX, X: Phonation is normal. CN XI: Head turning and shoulder shrug are intact  MOTOR: There is no  pronator drift of out-stretched arms. Muscle bulk and tone are normal. Muscle strength is normal.  REFLEXES: Reflexes are 2+ and symmetric at the biceps, triceps, knees, and ankles. Plantar responses are flexor.  SENSORY: Intact to light touch, pinprick and vibratory sensation are intact in fingers and toes.  COORDINATION: There is no trunk or limb dysmetria noted.  GAIT/STANCE: stable  REVIEW OF SYSTEMS:  Full 14 system review of systems performed and notable only for as above All other review of systems were negative.   ALLERGIES: No Known Allergies  HOME MEDICATIONS: Current Outpatient Medications  Medication Sig Dispense  Refill   carbamazepine (CARBATROL) 200 MG 12 hr capsule Take 400 mg by mouth daily.   0   gabapentin (NEURONTIN) 300 MG capsule Take 1 capsule (300 mg total) by mouth 3 (three) times daily. 90 capsule 0   lisinopril-hydrochlorothiazide (PRINZIDE,ZESTORETIC) 20-12.5 MG tablet Take 1 tablet by mouth once daily  0   oxyCODONE-acetaminophen (PERCOCET/ROXICET) 5-325 MG tablet Take 1 tablet by mouth every 6 (six) hours as needed for severe pain. 15 tablet 0   cyclobenzaprine (FLEXERIL) 10 MG tablet Take 1 tablet (10 mg total) by mouth 3 (three) times daily. (Patient not taking: Reported on 04/27/2018) 20 tablet 0   cyclobenzaprine (FLEXERIL) 10 MG tablet Take 1 tablet (10 mg total) by mouth 3 (three) times daily. (Patient not taking: Reported on 04/27/2018) 20 tablet 0   HYDROcodone-acetaminophen (NORCO/VICODIN) 5-325 MG tablet Take 1 tablet by mouth every 6 (six) hours as needed for moderate pain. (Patient not taking: Reported on 04/27/2018) 20 tablet 0   ibuprofen (ADVIL,MOTRIN) 800 MG tablet Take 1 tablet (800 mg total) by mouth 3 (three) times daily. (Patient not taking: Reported on 04/27/2018) 21 tablet 0   ibuprofen (ADVIL,MOTRIN) 800 MG tablet Take 1 tablet (800 mg total) by mouth 3 (three) times daily. (Patient not taking: Reported on 04/27/2018) 21 tablet 0   oxyCODONE-acetaminophen (PERCOCET/ROXICET) 5-325 MG tablet Take 1-2 tablets by mouth every 6 (six) hours as needed for severe pain. (Patient not taking: Reported on 12/10/2017) 30 tablet 0   simethicone (MYLICON) 80 MG chewable tablet Chew 1 tablet (80 mg total) by mouth 4 (four) times daily as needed for flatulence. (Patient not taking: Reported on 12/10/2017) 30 tablet 0   No current facility-administered medications for this visit.    PAST MEDICAL HISTORY: Past Medical History:  Diagnosis Date   Anemia    Fibroid, uterine    GERD (gastroesophageal reflux disease)    occasional -diet controlled   Hypertension    SVD (spontaneous vaginal  delivery)    x 3   Trigeminal neuralgia    Uterine fibroid 06/23/2017    PAST SURGICAL HISTORY: Past Surgical History:  Procedure Laterality Date   HYSTERECTOMY ABDOMINAL WITH SALPINGECTOMY Bilateral 06/23/2017   Procedure: HYSTERECTOMY ABDOMINAL WITH SALPINGECTOMY;  Surgeon: Shea Evans, MD;  Location: [facility];  Service: Gynecology;  Laterality: Bilateral;  2 hrs.    FAMILY HISTORY: No family history on file.  SOCIAL HISTORY: Social History   Socioeconomic History   Marital status: Married    Spouse name: Not on file   Number of children: Not on file   Years of education: Not on file   Highest education level: Not on file  Occupational History   Not on file  Tobacco Use   Smoking status: Former    Packs/day: 0.25    Years: 15.00    Additional pack years: 0.00    Total pack years: 3.75  Types: Cigarettes    Quit date: 05/10/2006    Years since quitting: 16.3   Smokeless tobacco: Never  Vaping Use   Vaping Use: Some days  Substance and Sexual Activity   Alcohol use: Yes    Comment: socially   Drug use: No   Sexual activity: Yes    Birth control/protection: None  Other Topics Concern   Not on file  Social History Narrative   Not on file   Social Determinants of Health   Financial Resource Strain: Not on file  Food Insecurity: Not on file  Transportation Needs: Not on file  Physical Activity: Not on file  Stress: Not on file  Social Connections: Not on file  Intimate Partner Violence: Not on file      Levert Feinstein, M.D. Ph.D.  [facility] [address] [place], [place] [address]  Ph: [phone] Fax: [phone]  CC:  Marva Panda, NP [address] [place],  [place] [address]  Marva Panda, NP

## 2022-09-21 NOTE — Patient Instructions (Signed)
gabapentin (NEURONTIN) 300 MG capsule    Sig: Take 3 capsules (900 mg total) by mouth 3 (three) times daily.    Dispense:  270 capsule    Refill:  11   OXcarbazepine (TRILEPTAL) 150 MG tablet    Sig: Take 2 tablets (300 mg total) by mouth 2 (two) times daily.    Dispense:  120 tablet    Refill:  11

## 2022-09-28 ENCOUNTER — Ambulatory Visit (INDEPENDENT_AMBULATORY_CARE_PROVIDER_SITE_OTHER): Payer: 59

## 2022-09-28 DIAGNOSIS — G500 Trigeminal neuralgia: Secondary | ICD-10-CM

## 2022-09-28 MED ORDER — GADOBENATE DIMEGLUMINE 529 MG/ML IV SOLN
15.0000 mL | Freq: Once | INTRAVENOUS | Status: AC | PRN
  Administered 2022-09-28: 15 mL via INTRAVENOUS

## 2022-09-30 ENCOUNTER — Telehealth: Payer: Self-pay | Admitting: Neurology

## 2022-09-30 NOTE — Telephone Encounter (Signed)
Call from patient inquiring on her gabapentin. Advised patient that script was at pharmacy and would be ready for pick up. Reviewed dosing and frequency of gabapentin and patient verbalized understanding. Patient also stated that she is taking the trileptal but is only taking 150mg  2x day and does have pain prior to when next dose of gabapentin is due. Advised that what is prescribed is 300mg  trileptal 2x daily but she can spread out to have cover for breakthrough pain if she desires that. Patient verbalized understanding. She asked about getting her percocet and advised she would need to see her PCP for that. Patient verbalized understanding and appreciative of call.

## 2022-09-30 NOTE — Telephone Encounter (Signed)
  Both were filled on 05.11.24, probably why they wont fill.  Please relay to patient. Dr. Terrace Arabia is not here to advise on taking over the controlled substance but it is not in her last office note

## 2022-09-30 NOTE — Telephone Encounter (Addendum)
Pt says that before Dr Terrace Arabia called in the gabapentin (NEURONTIN) 300 MG capsule on 09-21-22 the medication was being managed by her PCP.  The last bit of her gabapentin will finish up today and she will not have any after today.  Pt is being told by them that they will not fill the Rx from Dr Terrace Arabia, pt is asking the RN calls the pharmacy.  Pt states she does not want to risk being without this medication. Pt is also asking if Dr Terrace Arabia will take over managing her  oxyCODONE-acetaminophen (PERCOCET/ROXICET) 5-325 MG tablet , she said she has not been able to get a response from her PCP on the request of another refill on both medications.

## 2022-10-07 ENCOUNTER — Other Ambulatory Visit: Payer: Self-pay | Admitting: Neurology

## 2022-10-07 DIAGNOSIS — G939 Disorder of brain, unspecified: Secondary | ICD-10-CM

## 2022-10-07 NOTE — Progress Notes (Signed)
Please call and inform patient recent brain MRI showed an abnormality in the deep structure of the brain and that we need to repeat it in 6 weeks.

## 2022-10-16 ENCOUNTER — Encounter (HOSPITAL_BASED_OUTPATIENT_CLINIC_OR_DEPARTMENT_OTHER): Payer: Self-pay | Admitting: Emergency Medicine

## 2022-10-16 ENCOUNTER — Other Ambulatory Visit: Payer: Self-pay

## 2022-10-16 ENCOUNTER — Emergency Department (HOSPITAL_BASED_OUTPATIENT_CLINIC_OR_DEPARTMENT_OTHER)
Admission: EM | Admit: 2022-10-16 | Discharge: 2022-10-16 | Disposition: A | Discharge status: Home / Self Care | Payer: 59 | Attending: Emergency Medicine | Admitting: Emergency Medicine

## 2022-10-16 DIAGNOSIS — Z79899 Other long term (current) drug therapy: Secondary | ICD-10-CM | POA: Diagnosis not present

## 2022-10-16 DIAGNOSIS — R001 Bradycardia, unspecified: Secondary | ICD-10-CM | POA: Diagnosis not present

## 2022-10-16 DIAGNOSIS — I10 Essential (primary) hypertension: Secondary | ICD-10-CM | POA: Diagnosis present

## 2022-10-16 LAB — BASIC METABOLIC PANEL
Anion gap: 8 (ref 5–15)
BUN: 8 mg/dL (ref 6–20)
CO2: 28 mmol/L (ref 22–32)
Calcium: 9.4 mg/dL (ref 8.9–10.3)
Chloride: 98 mmol/L (ref 98–111)
Creatinine, Ser: 0.65 mg/dL (ref 0.44–1.00)
GFR, Estimated: >60 mL/min (ref >60)
Glucose, Bld: 79 mg/dL (ref 70–99)
Potassium: 3.5 mmol/L (ref 3.5–5.1)
Sodium: 134 mmol/L — ABNORMAL LOW (ref 135–145)

## 2022-10-16 MED ORDER — LISINOPRIL-HYDROCHLOROTHIAZIDE 20-12.5 MG PO TABS: 2.0000 | ORAL_TABLET | Freq: Every day | ORAL | 0 refills | Status: AC

## 2022-10-16 NOTE — ED Provider Notes (Signed)
[place] EMERGENCY DEPARTMENT AT [facility] Provider Note   CSN: [id number] Arrival date & time: 10/16/22  1352     History  Chief Complaint  Patient presents with   Hypertension    Jamie James is a 47 y.o. female.   Hypertension  Patient presents hypertension.  History of hypertension.  However for the last week blood pressures have been elevated.  Up to 170 or 190 systolic.  Diastolics have been consistently above 100.  Is on lisinopril hydrochlorothiazide at 20/12.5.  Had discussed with pharmacist and said that she could increase that dose.  Did take an extra dose this morning.  No chest pain.  No trouble breathing.  No numbness or weakness.  No vision changes. Also has a history of trigeminal neuralgia.  Has been flaring up recently although states she feels as if the pain is starting to get under control.  States a few weeks ago did have low blood pressures when she was not eating due to the pain.    Past Medical History:  Diagnosis Date   Anemia    Fibroid, uterine    GERD (gastroesophageal reflux disease)    occasional -diet controlled   Hypertension    SVD (spontaneous vaginal delivery)    x 3   Trigeminal neuralgia    Uterine fibroid 06/23/2017    Home Medications Prior to Admission medications   Medication Sig Start Date End Date Taking? Authorizing Provider  carbamazepine (CARBATROL) 200 MG 12 hr capsule Take 400 mg by mouth daily.  05/25/17   [provider]  gabapentin (NEURONTIN) 300 MG capsule Take 3 capsules (900 mg total) by mouth 3 (three) times daily. 09/21/22   Levert Feinstein, MD  HYDROcodone-acetaminophen (NORCO/VICODIN) 5-325 MG tablet Take 1 tablet by mouth every 6 (six) hours as needed for moderate pain. Patient not taking: Reported on 04/27/2018 12/10/17   Bethann Berkshire, MD  ibuprofen (ADVIL,MOTRIN) 800 MG tablet Take 1 tablet (800 mg total) by mouth 3 (three) times daily. Patient not taking: Reported on 04/27/2018 12/10/17   Bethann Berkshire, MD  ibuprofen (ADVIL,MOTRIN) 800 MG tablet Take 1 tablet (800 mg total) by mouth 3 (three) times daily. Patient not taking: Reported on 04/27/2018 12/10/17   Bethann Berkshire, MD  lisinopril-hydrochlorothiazide (ZESTORETIC) 20-12.5 MG tablet Take 2 tablets by mouth daily. 10/16/22   Benjiman Core, MD  OXcarbazepine (TRILEPTAL) 150 MG tablet Take 2 tablets (300 mg total) by mouth 2 (two) times daily. 09/21/22   Levert Feinstein, MD  oxyCODONE-acetaminophen (PERCOCET/ROXICET) 5-325 MG tablet Take 1-2 tablets by mouth every 6 (six) hours as needed for severe pain. Patient not taking: Reported on 12/10/2017 06/25/17   Shea Evans, MD  oxyCODONE-acetaminophen (PERCOCET/ROXICET) 5-325 MG tablet Take 1 tablet by mouth every 6 (six) hours as needed for severe pain. 09/17/22   Lorre Nick, MD      Allergies    Patient has no known allergies.    Review of Systems   Review of Systems  Physical Exam Updated Vital Signs BP (!) 152/95   Pulse (!) 56   Temp 97.7 F (36.5 C)   Resp 13   Ht 5\' 4"  (1.626 m)   Wt 88 kg   LMP 05/28/2017 (Approximate)   SpO2 100%   BMI 33.30 kg/m  Physical Exam Vitals and nursing note reviewed.  Cardiovascular:     Rate and Rhythm: Regular rhythm. Bradycardia present.  Pulmonary:     Breath sounds: No wheezing.  Abdominal:     Tenderness:  There is no abdominal tenderness.  Musculoskeletal:     Cervical back: Neck supple.  Neurological:     Mental Status: She is alert and oriented to person, place, and time.     ED Results / Procedures / Treatments   Labs (all labs ordered are listed, but only abnormal results are displayed) Labs Reviewed  BASIC METABOLIC PANEL - Abnormal; Notable for the following components:      Result Value   Sodium 134 (*)    All other components within normal limits    EKG EKG Interpretation  Date/Time:  Saturday October 16 2022 14:08:54 EDT Ventricular Rate:  58 PR Interval:  208 QRS Duration: 94 QT Interval:  416 QTC  Calculation: 408 R Axis:   58 Text Interpretation: Sinus bradycardia Minimal voltage criteria for LVH, may be normal variant ( Cornell product ) Anterior infarct , age undetermined Abnormal ECG When compared with ECG of 22-Jun-2017 09:46, Anterior infarct is now Present Confirmed by Vivien Rossetti ([id number]) on 10/16/2022 2:36:19 PM  Radiology No results found.  Procedures Procedures    Medications Ordered in ED Medications - No data to display  ED Course/ Medical Decision Making/ A&P                             Medical Decision Making Amount and/or Complexity of Data Reviewed Labs: ordered.  Risk Prescription drug management.   Patient with hypertension.  Asymptomatic.  No chest pain no numbness weakness.  However is not on at highest dose of her lisinopril/hydrochlorothiazide.  Should be able to increase.  However with increasing diuretic will check kidney function.  EKG reassuring.  Blood pressure has come down somewhat.  Did take her blood pressure medicine earlier today and did take meds for trigeminal neuralgia.  For now we will increase her lisinopril hydrochlorothiazide to twice a day.  Will follow-up with PCP.  Appears stable for discharge home.          Final Clinical Impression(s) / ED Diagnoses Final diagnoses:  Hypertension, unspecified type    Rx / DC Orders ED Discharge Orders          Ordered    lisinopril-hydrochlorothiazide (ZESTORETIC) 20-12.5 MG tablet  Daily        10/16/22 1648              Benjiman Core, MD 10/16/22 1657

## 2022-10-16 NOTE — ED Triage Notes (Signed)
Report HTN at home for past week. 190/100 at home today. Called pharmacy and advised she could take a second dose of lisinopril.  No other complaints- denies CP, SOB, vision changes, HA.

## 2022-10-16 NOTE — Discharge Instructions (Addendum)
Increase your lisinopril to 2 pills a day.  Follow-up with your doctor.

## 2022-11-05 ENCOUNTER — Encounter: Payer: Self-pay | Admitting: Physician Assistant

## 2022-11-05 ENCOUNTER — Ambulatory Visit: Payer: 59 | Admitting: Physician Assistant

## 2022-11-05 VITALS — BP 130/88 | HR 67 | Wt 195.0 lb

## 2022-11-05 DIAGNOSIS — G500 Trigeminal neuralgia: Secondary | ICD-10-CM | POA: Diagnosis not present

## 2022-11-05 NOTE — Progress Notes (Signed)
CC: Trigeminal neuralgia   Wanting a second opinion   Has seen 1 neurologist- [facility]   History:  Jamie James is a 47 y.o. [phone] who presents to clinic today for additional consultation regarding her trigeminal neuralgia.  She notes extreme right sided facial pain of all 3 branches of the trigeminal nerve that re-emerged last month after being well controlled over 10 years.  Her first episode was in 2008 and had been stable on anti-epileptic.  She is uncertain what triggered the return.  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  She has had evaluation at the ED and with neurology.  She had an MRI last month that revealed an area of concern.  She will have a f/u MRI next week to discover if it is stable.  She is using gabapentin 900mg  3 times daily along with trileptal 150mg  bid.  She notes she is better able to cope with the pain but it is continuing to occur.  She has also had a limited supply of hydrocodone that has been helpful.  She is not using ibuprofen or other anti-inflammatory.   Past Medical History:  Diagnosis Date   Anemia    Fibroid, uterine    GERD (gastroesophageal reflux disease)    occasional -diet controlled   Hypertension    SVD (spontaneous vaginal delivery)    x 3   Trigeminal neuralgia    Uterine fibroid 06/23/2017    Social History   Socioeconomic History   Marital status: Married    Spouse name: Not on file   Number of children: Not on file   Years of education: Not on file   Highest education level: Not on file  Occupational History   Not on file  Tobacco Use   Smoking status: Former    Packs/day: 0.25    Years: 15.00    Additional pack years: 0.00    Total pack years: 3.75    Types: Cigarettes    Quit date: 05/10/2006    Years since quitting: 16.5   Smokeless tobacco: Never  Vaping Use   Vaping Use: Some days   Substance and Sexual Activity   Alcohol use: Yes    Comment: socially   Drug use: No   Sexual activity: Yes    Birth control/protection: None  Other Topics Concern   Not on file  Social History Narrative   Not on file   Social Determinants of Health   Financial Resource Strain: Not on file  Food Insecurity: Not on file  Transportation Needs: Not on file  Physical Activity: Not on file  Stress: Not on file  Social Connections: Not on file  Intimate Partner Violence: Not on file    History reviewed. No pertinent family history.  No Known Allergies  Current Outpatient Medications on File Prior to Visit  Medication Sig Dispense Refill   gabapentin (NEURONTIN) 300 MG capsule Take 3 capsules (900 mg total) by mouth 3 (three) times daily. 270 capsule 11   lisinopril-hydrochlorothiazide (ZESTORETIC) 20-12.5 MG tablet Take 2 tablets by mouth daily. 60 tablet 0   OXcarbazepine (TRILEPTAL) 150 MG tablet Take 2 tablets (300 mg total) by mouth 2 (two) times daily. 120 tablet 11   oxyCODONE-acetaminophen (PERCOCET/ROXICET) 5-325 MG tablet Take 1 tablet by mouth every 6 (six) hours as needed for severe pain. 15 tablet 0   HYDROcodone-acetaminophen (NORCO/VICODIN) 5-325 MG tablet Take  1 tablet by mouth every 6 (six) hours as needed for moderate pain. (Patient not taking: Reported on 04/27/2018) 20 tablet 0   ibuprofen (ADVIL,MOTRIN) 800 MG tablet Take 1 tablet (800 mg total) by mouth 3 (three) times daily. (Patient not taking: Reported on 04/27/2018) 21 tablet 0   ibuprofen (ADVIL,MOTRIN) 800 MG tablet Take 1 tablet (800 mg total) by mouth 3 (three) times daily. (Patient not taking: Reported on 04/27/2018) 21 tablet 0   oxyCODONE-acetaminophen (PERCOCET/ROXICET) 5-325 MG tablet Take 1-2 tablets by mouth every 6 (six) hours as needed for severe pain. (Patient not taking: Reported on 12/10/2017) 30 tablet 0   No current facility-administered medications on file prior to visit.     Review of  Systems:  All pertinent positive/negative included in HPI, all other review of systems are negative   Objective:  Physical Exam BP 130/88   Pulse 67   Wt 195 lb (88.5 kg)   LMP 05/28/2017 (Approximate)   BMI 33.47 kg/m  CONSTITUTIONAL: Well-developed, well-nourished female in no acute distress.  EYES: EOM intact ENT: Normocephalic CARDIOVASCULAR: Regular rate  RESPIRATORY: Normal rate.  MUSCULOSKELETAL: Normal ROM SKIN: Warm, dry without erythema  NEUROLOGICAL: Alert, oriented, CN II-XII grossly intact, Appropriate balance PSYCH: Normal behavior, mood   Assessment & Plan:  Assessment: 1. Right trigeminal neuralgia      Plan: Pt reassured that she is currently on an appropriate medication regimen.   MRI results reviewed and briefly discussed with patient.  She will have follow-up imaging next week on 7/2.  I would expect the results thereof will determine next steps.   I have encouraged pt to continue care with neuro and pain management.  She is uneasy about the possibility of more invasive treatment such as surgery. The treatments of trigeminal neuralgia have advanced significantly since she was first considering considering in 2008.   I have provided reassurance on the advancement of multiple therapeutic options which can provide substantial relief.    51 minutes spent in direct care of this patient on this encounter.    Bertram Denver, PA-C 11/05/2022 9:41 AM

## 2022-11-09 ENCOUNTER — Ambulatory Visit (INDEPENDENT_AMBULATORY_CARE_PROVIDER_SITE_OTHER): Payer: 59

## 2022-11-09 DIAGNOSIS — G939 Disorder of brain, unspecified: Secondary | ICD-10-CM | POA: Diagnosis not present

## 2022-11-10 ENCOUNTER — Telehealth: Payer: Self-pay | Admitting: Neurology

## 2022-11-10 NOTE — Telephone Encounter (Signed)
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  Compared to the previous MRI from 09/28/2022 the previously seen 1.2 cm corpus callosal hyperintensity appears to have resolved.

## 2023-01-17 ENCOUNTER — Ambulatory Visit: Payer: 59 | Admitting: Adult Health

## 2023-01-17 NOTE — Progress Notes (Deleted)
No chief complaint on file.     ASSESSMENT AND PLAN  Jamie James is a 47 y.o. female   Right trigeminal neuralgia  MRI brain w/wo unremarkable  Higher dose of gabapentin 300mg  3 tabs tid, Trileptal 150mg  up to 2 bid   Vitamin D supplement   Return To Clinic With NP In 3 Months     DIAGNOSTIC DATA (LABS, IMAGING, TESTING) - I reviewed patient records, labs, notes, testing and imaging myself where available.  MRI brain 09/30/2022 IMPRESSION:  MRI brain (with and without) demonstrating: -Round, midline, 1.2cm DWI hyperintense lesion within splenium of corpus callosum, with restricted diffusion on ADC map.  Considerations could include focal acute-subacute cerebral ischemia, reversible splenial lesion (such as carbamazepine withdrawal), or nutritional deficiency (Marchiafava-Bignami syndrome).  Recommend follow-up MRI in 4 to 6 weeks to ensure stability or resolution. -Remainder of brain parenchyma unremarkable.  No abnormal enhancing or compressive lesions of trigeminal nerve courses.  MRI brain  11/09/2022 IMPRESSION: Unremarkable MRI scan of the brain without contrast.  Compared to the previous MRI from 09/28/2022 the previously seen 1.2 cm corpus callosal hyperintensity appears to have resolved.     MEDICAL HISTORY:  Update 01/17/2023 JM: Returns for follow-up visit.  At prior visit, gabapentin dosage increased to 300 mg 3 times daily and initiated Trileptal 150 mg twice daily.      Consult visit 09/21/2022 Dr. Terrace Arabia: Jamie James is a 47 year old female accompanied by her husband, seen in request by her primary care physician from [facility] NP Marva Panda for evaluation of recurrent right trigeminal facial pain, initial evaluation was on Sep 21, 2022    I reviewed and summarized the referring note.PMHx. HTN Right trigeminal neuralgia  This is the second time she had flareup of right facial pain, started end of April 2024, mostly involving right  lower face jaw, but at least moderate involvement of right cheek, even forehead area, she works as a IT sales professional, because of significant right facial pain, that is often triggered by talking, chewing, she could not eat and sleep well, was not able to continue her job now  First flareup was in 2008, significant right facial pain, eventually was able to bring under control taking Tegretol XR 200 mg 2 tablets 3 times a day, but prior to that, she was seen by different specialist, had few tubes pulled out, the first flareup last about 2 months.  Over the years, she has maintained on Tegretol despite she has no recurrent right facial pain, about few years ago, because she was doing so well, she taper down the Tegretol to 200 mg 3 times a day, eventually twice a day  When she began to have flareup of right facial pain, she went up to previous dosage 200 mg 2 tablets 3 times a day, recent laboratory evaluation showed elevated level, she has stopped the medication abruptly couple days ago, was put on gabapentin 300 mg 3 times a day Sep 20, 2022, which seems to help her some, no significant side effect noted PHYSICAL EXAM:   There were no vitals filed for this visit.    There is no height or weight on file to calculate BMI.  PHYSICAL EXAMNIATION:  Gen: NAD, conversant, well nourised, well groomed                     Cardiovascular: Regular rate rhythm, no peripheral edema, warm, nontender. Eyes: Conjunctivae clear without exudates or hemorrhage Neck: Supple, no carotid bruits. Pulmonary:  Clear to auscultation bilaterally   NEUROLOGICAL EXAM:  MENTAL STATUS: Speech/cognition: Mild distress,  awake, alert, oriented to history taking and casual conversation CRANIAL NERVES: CN II: Visual fields are full to confrontation. Pupils are round equal and briskly reactive to light. CN III, IV, VI: extraocular movement are normal. No ptosis. CN V: Facial sensation is intact to light touch, corneal reflexes  were normal bilaterally CN VII: Face is symmetric with normal eye closure  CN VIII: Hearing is normal to causal conversation. CN IX, X: Phonation is normal. CN XI: Head turning and shoulder shrug are intact  MOTOR: There is no pronator drift of out-stretched arms. Muscle bulk and tone are normal. Muscle strength is normal.  REFLEXES: Reflexes are 2+ and symmetric at the biceps, triceps, knees, and ankles. Plantar responses are flexor.  SENSORY: Intact to light touch, pinprick and vibratory sensation are intact in fingers and toes.  COORDINATION: There is no trunk or limb dysmetria noted.  GAIT/STANCE: stable  REVIEW OF SYSTEMS:  Full 14 system review of systems performed and notable only for as above All other review of systems were negative.   ALLERGIES: No Known Allergies  HOME MEDICATIONS: Current Outpatient Medications  Medication Sig Dispense Refill   gabapentin (NEURONTIN) 300 MG capsule Take 3 capsules (900 mg total) by mouth 3 (three) times daily. 270 capsule 11   HYDROcodone-acetaminophen (NORCO/VICODIN) 5-325 MG tablet Take 1 tablet by mouth every 6 (six) hours as needed for moderate pain. (Patient not taking: Reported on 04/27/2018) 20 tablet 0   lisinopril-hydrochlorothiazide (ZESTORETIC) 20-12.5 MG tablet Take 2 tablets by mouth daily. 60 tablet 0   OXcarbazepine (TRILEPTAL) 150 MG tablet Take 2 tablets (300 mg total) by mouth 2 (two) times daily. 120 tablet 11   oxyCODONE-acetaminophen (PERCOCET/ROXICET) 5-325 MG tablet Take 1-2 tablets by mouth every 6 (six) hours as needed for severe pain. (Patient not taking: Reported on 12/10/2017) 30 tablet 0   oxyCODONE-acetaminophen (PERCOCET/ROXICET) 5-325 MG tablet Take 1 tablet by mouth every 6 (six) hours as needed for severe pain. 15 tablet 0   No current facility-administered medications for this visit.    PAST MEDICAL HISTORY: Past Medical History:  Diagnosis Date   Anemia    Fibroid, uterine    GERD  (gastroesophageal reflux disease)    occasional -diet controlled   Hypertension    SVD (spontaneous vaginal delivery)    x 3   Trigeminal neuralgia    Uterine fibroid 06/23/2017    PAST SURGICAL HISTORY: Past Surgical History:  Procedure Laterality Date   HYSTERECTOMY ABDOMINAL WITH SALPINGECTOMY Bilateral 06/23/2017   Procedure: HYSTERECTOMY ABDOMINAL WITH SALPINGECTOMY;  Surgeon: Shea Evans, MD;  Location: [facility];  Service: Gynecology;  Laterality: Bilateral;  2 hrs.    FAMILY HISTORY: No family history on file.  SOCIAL HISTORY: Social History   Socioeconomic History   Marital status: Married    Spouse name: Not on file   Number of children: Not on file   Years of education: Not on file   Highest education level: Not on file  Occupational History   Not on file  Tobacco Use   Smoking status: Former    Current packs/day: 0.00    Average packs/day: 0.3 packs/day for 15.0 years (3.8 ttl pk-yrs)    Types: Cigarettes    Start date: 05/11/1991    Quit date: 05/10/2006    Years since quitting: 16.7   Smokeless tobacco: Never  Vaping Use   Vaping status: Some Days  Substance and  Sexual Activity   Alcohol use: Yes    Comment: socially   Drug use: No   Sexual activity: Yes    Birth control/protection: None  Other Topics Concern   Not on file  Social History Narrative   Not on file   Social Determinants of Health   Financial Resource Strain: Not on file  Food Insecurity: Not on file  Transportation Needs: Not on file  Physical Activity: Not on file  Stress: Not on file  Social Connections: Not on file  Intimate Partner Violence: Not on file      I spent *** minutes of face-to-face and non-face-to-face time with patient.  This included previsit chart review, lab review, study review, order entry, electronic health record documentation, patient education and discussion regarding above diagnoses and treatment plan and answered all other questions to patient's  satisfaction  Ihor Austin, [facility]  [facility] [address] [place], [place] [address]  Phone [phone] Fax [phone] Note: This document was prepared with digital dictation and possible smart phrase technology. Any transcriptional errors that result from this process are unintentional.

## 2023-10-06 ENCOUNTER — Other Ambulatory Visit: Payer: Self-pay | Admitting: Neurology

## 2023-10-06 ENCOUNTER — Telehealth: Payer: Self-pay

## 2023-10-06 NOTE — Telephone Encounter (Signed)
 Mychart message sent.

## 2023-12-12 ENCOUNTER — Other Ambulatory Visit (HOSPITAL_BASED_OUTPATIENT_CLINIC_OR_DEPARTMENT_OTHER): Payer: Self-pay | Admitting: Family Medicine

## 2023-12-12 DIAGNOSIS — Z8249 Family history of ischemic heart disease and other diseases of the circulatory system: Secondary | ICD-10-CM

## 2023-12-21 ENCOUNTER — Other Ambulatory Visit (HOSPITAL_BASED_OUTPATIENT_CLINIC_OR_DEPARTMENT_OTHER)

## 2023-12-28 ENCOUNTER — Ambulatory Visit (HOSPITAL_BASED_OUTPATIENT_CLINIC_OR_DEPARTMENT_OTHER)
Admission: RE | Admit: 2023-12-28 | Discharge: 2023-12-28 | Disposition: A | Discharge status: Home / Self Care | Payer: Self-pay | Source: Ambulatory Visit | Attending: Family Medicine | Admitting: Family Medicine

## 2023-12-28 DIAGNOSIS — Z8249 Family history of ischemic heart disease and other diseases of the circulatory system: Secondary | ICD-10-CM

## 2024-01-19 ENCOUNTER — Other Ambulatory Visit: Payer: Self-pay | Admitting: Neurology

## 2024-01-25 ENCOUNTER — Ambulatory Visit (INDEPENDENT_AMBULATORY_CARE_PROVIDER_SITE_OTHER): Admitting: Adult Health

## 2024-01-25 ENCOUNTER — Encounter: Payer: Self-pay | Admitting: Adult Health

## 2024-01-25 VITALS — BP 112/82 | HR 87 | Ht 64.0 in | Wt 199.0 lb

## 2024-01-25 DIAGNOSIS — G500 Trigeminal neuralgia: Secondary | ICD-10-CM

## 2024-01-25 MED ORDER — GABAPENTIN 300 MG PO CAPS: 900.0000 mg | ORAL_CAPSULE | Freq: Three times a day (TID) | ORAL | 11 refills | Status: AC

## 2024-01-25 MED ORDER — OXCARBAZEPINE 150 MG PO TABS: 300.0000 mg | ORAL_TABLET | Freq: Two times a day (BID) | ORAL | 11 refills | Status: AC

## 2024-01-25 NOTE — Patient Instructions (Addendum)
 Your Plan:  Continue gabapentin  900 mg daily and increase as needed to 900mg  three times daily if needed  Continue oxcarbazepine  300mg  daily and increase up to 300mg  twice daily as needed      Follow up in 1 year or call earlier if needed      Thank you for coming to see us  at [facility]. I hope we have been able to provide you high quality care today.  You may receive a patient satisfaction survey over the next few weeks. We would appreciate your feedback and comments so that we may continue to improve ourselves and the health of our patients.

## 2024-01-25 NOTE — Progress Notes (Signed)
 Chief Complaint  Patient presents with   Trigeminal Neuralgia    Rm 3 alone Pt is well and stable, reports symptoms have been manageable on gabapentin . No new concerns.       ASSESSMENT AND PLAN  Jamie James is a 48 y.o. female   Right trigeminal neuralgia  Continue gabapentin  - currently taking 900mg  daily but will keep rx for 900mg  TID incase of flare up  Continue oxcarbazepine  - currently taking 300mg  daily but will keep rx for 300mg  BID incase of flare up  Routine lab work by PCP with prior sodium level satisfactory  Advised to call with any worsening symptoms    Follow-up in 1 year via MyChart video visit or call earlier if needed      DIAGNOSTIC DATA (LABS, IMAGING, TESTING) - I reviewed patient records, labs, notes, testing and imaging myself where available.  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  Considerations could include focal acute-subacute cerebral ischemia, reversible splenial lesion (such as carbamazepine  withdrawal), or nutritional deficiency (Marchiafava-Bignami syndrome).  Recommend follow-up MRI in 4 to 6 weeks to ensure stability or resolution. -Remainder of brain parenchyma unremarkable.  No abnormal enhancing or compressive lesions of trigeminal nerve courses.  11/09/2022 MRI brain wo contrast  IMPRESSION: Unremarkable MRI scan of the brain without contrast.  Compared to the previous MRI from 09/28/2022 the previously seen 1.2 cm corpus callosal hyperintensity appears to have resolved.       MEDICAL HISTORY:  Update 01/25/2024 JM: Patient returns for overdue follow-up visit and need of medication refills.  Reports symptoms have been well-managed and has not had any recent flareups.  She has been able to gradually reduce the dosage of gabapentin  currently taking 900 mg daily and oxcarbazepine  300 mg daily.  She will  increase the dosage as if needed with any increased pain or flareup.  She is tolerating current medications well without side effects.  No questions or concerns at this time.      Consult visit 09/21/2022 Dr. Onita: Jamie James is a 48 year old female accompanied by her husband, seen in request by her primary care physician from [facility] NP Knute Iha for evaluation of recurrent right trigeminal facial pain, initial evaluation was on Sep 21, 2022    I reviewed and summarized the referring note.PMHx. HTN Right trigeminal neuralgia  This is the second time she had flareup of right facial pain, started end of April 2024, mostly involving right lower face jaw, but at least moderate involvement of right cheek, even forehead area, she works as a IT sales professional, because of significant right facial pain, that is often triggered by talking, chewing, she could not eat and sleep well, was not able to continue her job now  First flareup was in 2008, significant right facial pain, eventually was able to bring under control taking Tegretol  XR 200 mg 2 tablets 3 times a day, but prior to that, she was seen by different specialist, had few tubes pulled out, the first flareup last about 2 months.  Over the years, she has maintained on Tegretol  despite she has no recurrent right facial pain, about few years ago, because she was doing so well, she taper down the Tegretol  to 200 mg 3 times a day, eventually twice a day  When she began to have flareup of right facial pain, she went up to previous dosage 200 mg 2 tablets 3  times a day, recent laboratory evaluation showed elevated level, she has stopped the medication abruptly couple days ago, was put on gabapentin  300 mg 3 times a day Sep 20, 2022, which seems to help her some, no significant side effect noted    PHYSICAL EXAM:   Vitals:   01/25/24 1036  BP: 112/82  Pulse: 87  Weight: 199 lb (90.3 kg)  Height: 5' 4 (1.626 m)   Body  mass index is 34.16 kg/m.  PHYSICAL EXAMNIATION:  Gen: NAD, conversant, well nourised, well groomed                     Cardiovascular: Regular rate rhythm, no peripheral edema, warm, nontender. Eyes: Conjunctivae clear without exudates or hemorrhage Neck: Supple, no carotid bruits. Pulmonary: Clear to auscultation bilaterally   NEUROLOGICAL EXAM:  MENTAL STATUS: Speech/cognition: Mild distress,  awake, alert, oriented to history taking and casual conversation CRANIAL NERVES: CN II: Visual fields are full to confrontation. Pupils are round equal and briskly reactive to light. CN III, IV, VI: extraocular movement are normal. No ptosis. CN V: Facial sensation is intact to light touch, corneal reflexes were normal bilaterally CN VII: Face is symmetric with normal eye closure  CN VIII: Hearing is normal to causal conversation. CN IX, X: Phonation is normal. CN XI: Head turning and shoulder shrug are intact  MOTOR: There is no pronator drift of out-stretched arms. Muscle bulk and tone are normal. Muscle strength is normal.  REFLEXES: Reflexes are 2+ and symmetric at the biceps, triceps, knees, and ankles. Plantar responses are flexor.  SENSORY: Intact to light touch, pinprick and vibratory sensation are intact in fingers and toes.  COORDINATION: There is no trunk or limb dysmetria noted.  GAIT/STANCE: stable    REVIEW OF SYSTEMS:  Full 14 system review of systems performed and notable only for as above All other review of systems were negative.   ALLERGIES: No Known Allergies  HOME MEDICATIONS: Current Outpatient Medications  Medication Sig Dispense Refill   gabapentin  (NEURONTIN ) 300 MG capsule Take 3 capsules (900 mg total) by mouth 3 (three) times daily. (Patient taking differently: Take 900 mg by mouth once. 900 mg am) 270 capsule 11   lisinopril -hydrochlorothiazide  (ZESTORETIC ) 20-12.5 MG tablet Take 2 tablets by mouth daily. 60 tablet 0   OXcarbazepine   (TRILEPTAL ) 150 MG tablet TAKE 2 TABLETS(300 MG) BY MOUTH TWICE DAILY 120 tablet 11   No current facility-administered medications for this visit.    PAST MEDICAL HISTORY: Past Medical History:  Diagnosis Date   Anemia    Fibroid, uterine    GERD (gastroesophageal reflux disease)    occasional -diet controlled   Hypertension    SVD (spontaneous vaginal delivery)    x 3   Trigeminal neuralgia    Uterine fibroid 06/23/2017    PAST SURGICAL HISTORY: Past Surgical History:  Procedure Laterality Date   HYSTERECTOMY ABDOMINAL WITH SALPINGECTOMY Bilateral 06/23/2017   Procedure: HYSTERECTOMY ABDOMINAL WITH SALPINGECTOMY;  Surgeon: Barbette Knock, MD;  Location: [facility];  Service: Gynecology;  Laterality: Bilateral;  2 hrs.    FAMILY HISTORY: History reviewed. No pertinent family history.  SOCIAL HISTORY: Social History   Socioeconomic History   Marital status: Married    Spouse name: Not on file   Number of children: Not on file   Years of education: Not on file   Highest education level: Not on file  Occupational History   Not on file  Tobacco Use   Smoking status: Former  Current packs/day: 0.00    Average packs/day: 0.3 packs/day for 15.0 years (3.8 ttl pk-yrs)    Types: Cigarettes    Start date: 05/11/1991    Quit date: 05/10/2006    Years since quitting: 17.7   Smokeless tobacco: Never  Vaping Use   Vaping status: Some Days  Substance and Sexual Activity   Alcohol use: Yes    Comment: socially   Drug use: No   Sexual activity: Yes    Birth control/protection: None  Other Topics Concern   Not on file  Social History Narrative   Not on file   Social Drivers of Health   Financial Resource Strain: Not on file  Food Insecurity: Not on file  Transportation Needs: Not on file  Physical Activity: Not on file  Stress: Not on file  Social Connections: Not on file  Intimate Partner Violence: Not on file      I personally spent a total of 15 minutes in the care  of the patient today including preparing to see the patient, performing a medically appropriate exam/evaluation, counseling and educating, placing orders, and documenting clinical information in the EHR.  Harlene Bogaert, AGNP-BC  [facility] [address] [place], [place] [address]  Phone [phone] Fax [phone] Note: This document was prepared with digital dictation and possible smart phrase technology. Any transcriptional errors that result from this process are unintentional.

## 2025-01-31 ENCOUNTER — Telehealth: Admitting: Adult Health
